# Patient Record
Sex: Male | Born: 1955 | Race: White | Hispanic: No | Smoking: Never smoker
Health system: Southern US, Community
[De-identification: ages and names within clinical notes are randomized; demographics above are authoritative.]

## PROBLEM LIST (undated history)

## (undated) HISTORY — PX: NOSE SURGERY: SHX723

## (undated) HISTORY — PX: FINGER SURGERY: SHX640

---

## 2006-06-29 ENCOUNTER — Emergency Department (HOSPITAL_COMMUNITY): Admission: EM | Admit: 2006-06-29 | Discharge: 2006-06-29 | Payer: Self-pay | Admitting: Emergency Medicine

## 2006-06-30 ENCOUNTER — Ambulatory Visit (HOSPITAL_COMMUNITY): Admission: RE | Admit: 2006-06-30 | Discharge: 2006-06-30 | Payer: Self-pay | Admitting: Emergency Medicine

## 2006-07-03 ENCOUNTER — Encounter: Admission: RE | Admit: 2006-07-03 | Discharge: 2006-07-03 | Payer: Self-pay | Admitting: Emergency Medicine

## 2009-09-16 ENCOUNTER — Emergency Department (HOSPITAL_COMMUNITY): Admission: EM | Admit: 2009-09-16 | Discharge: 2009-09-16 | Payer: Self-pay | Admitting: Emergency Medicine

## 2011-03-26 LAB — BASIC METABOLIC PANEL
Calcium: 8.9 mg/dL (ref 8.4–10.5)
GFR calc non Af Amer: >60 mL/min (ref >60)
Sodium: 137 mEq/L (ref 135–145)

## 2011-03-26 LAB — BLOOD GAS, ARTERIAL
Acid-Base Excess: 3.1 mmol/L — ABNORMAL HIGH (ref 0.0–2.0)
TCO2: 24.4 mmol/L (ref 0–100)
pCO2 arterial: 44.7 mmHg (ref 35.0–45.0)
pH, Arterial: 7.406 (ref 7.350–7.450)
pO2, Arterial: 70.4 mmHg — ABNORMAL LOW (ref 80.0–100.0)

## 2011-03-26 LAB — CBC
HCT: 36.3 % — ABNORMAL LOW (ref 39.0–52.0)
Platelets: 250 10*3/uL (ref 150–400)

## 2011-03-26 LAB — DIFFERENTIAL
Basophils Absolute: 0 10*3/uL (ref 0.0–0.1)
Basophils Relative: 0 % (ref 0–1)
Eosinophils Relative: 4 % (ref 0–5)
Neutrophils Relative %: 65 % (ref 43–77)

## 2011-06-05 ENCOUNTER — Emergency Department (HOSPITAL_COMMUNITY): Payer: Self-pay

## 2011-06-05 ENCOUNTER — Emergency Department (HOSPITAL_COMMUNITY)
Admission: EM | Admit: 2011-06-05 | Discharge: 2011-06-05 | Disposition: A | Discharge status: Home / Self Care | Payer: Self-pay | Attending: Emergency Medicine | Admitting: Emergency Medicine

## 2011-06-05 DIAGNOSIS — K089 Disorder of teeth and supporting structures, unspecified: Secondary | ICD-10-CM | POA: Insufficient documentation

## 2011-06-05 DIAGNOSIS — L0201 Cutaneous abscess of face: Secondary | ICD-10-CM | POA: Insufficient documentation

## 2011-06-05 DIAGNOSIS — L03211 Cellulitis of face: Secondary | ICD-10-CM | POA: Insufficient documentation

## 2011-06-05 LAB — BASIC METABOLIC PANEL
BUN: 8 mg/dL (ref 6–23)
Calcium: 10.2 mg/dL (ref 8.4–10.5)
Chloride: 99 mEq/L (ref 96–112)
Creatinine, Ser: 0.97 mg/dL (ref 0.50–1.35)
GFR calc non Af Amer: >60 mL/min (ref >60)
Potassium: 4.1 mEq/L (ref 3.5–5.1)
Sodium: 138 mEq/L (ref 135–145)

## 2011-06-05 LAB — DIFFERENTIAL
Basophils Absolute: 0.1 10*3/uL (ref 0.0–0.1)
Basophils Relative: 1 % (ref 0–1)
Eosinophils Absolute: 0.3 10*3/uL (ref 0.0–0.7)
Eosinophils Relative: 2 % (ref 0–5)
Lymphs Abs: 2.8 10*3/uL (ref 0.7–4.0)
Monocytes Absolute: 1.4 10*3/uL — ABNORMAL HIGH (ref 0.1–1.0)
Monocytes Relative: 10 % (ref 3–12)
Neutrophils Relative %: 68 % (ref 43–77)

## 2011-06-05 LAB — CBC
Hemoglobin: 13.7 g/dL (ref 13.0–17.0)
RBC: 4.33 MIL/uL (ref 4.22–5.81)
WBC: 14.3 10*3/uL — ABNORMAL HIGH (ref 4.0–10.5)

## 2011-06-05 MED ORDER — IOHEXOL 300 MG/ML  SOLN
100.0000 mL | Freq: Once | INTRAMUSCULAR | Status: AC | PRN
  Administered 2011-06-05: 100 mL via INTRAVENOUS

## 2011-10-15 ENCOUNTER — Emergency Department (HOSPITAL_COMMUNITY)
Admission: EM | Admit: 2011-10-15 | Discharge: 2011-10-15 | Disposition: A | Discharge status: Home / Self Care | Payer: No Typology Code available for payment source | Attending: Emergency Medicine | Admitting: Emergency Medicine

## 2011-10-15 ENCOUNTER — Emergency Department (HOSPITAL_COMMUNITY): Payer: No Typology Code available for payment source

## 2011-10-15 DIAGNOSIS — R7309 Other abnormal glucose: Secondary | ICD-10-CM | POA: Insufficient documentation

## 2011-10-15 DIAGNOSIS — D72829 Elevated white blood cell count, unspecified: Secondary | ICD-10-CM | POA: Insufficient documentation

## 2011-10-15 DIAGNOSIS — K029 Dental caries, unspecified: Secondary | ICD-10-CM | POA: Insufficient documentation

## 2011-10-15 DIAGNOSIS — K047 Periapical abscess without sinus: Secondary | ICD-10-CM | POA: Insufficient documentation

## 2011-10-15 DIAGNOSIS — Z6841 Body Mass Index (BMI) 40.0 and over, adult: Secondary | ICD-10-CM | POA: Insufficient documentation

## 2011-10-15 LAB — CBC
Hemoglobin: 13.5 g/dL (ref 13.0–17.0)
MCH: 31 pg (ref 26.0–34.0)
RBC: 4.35 MIL/uL (ref 4.22–5.81)
RDW: 14.1 % (ref 11.5–15.5)
WBC: 11.6 10*3/uL — ABNORMAL HIGH (ref 4.0–10.5)

## 2011-10-15 LAB — POCT I-STAT, CHEM 8
Calcium, Ion: 1.13 mmol/L (ref 1.12–1.32)
Creatinine, Ser: 1 mg/dL (ref 0.50–1.35)

## 2011-10-15 MED ORDER — SODIUM CHLORIDE 0.9 % IV SOLN: INTRAVENOUS | Status: DC

## 2011-10-15 MED ORDER — OXYCODONE HCL 5 MG PO TABS
10.0000 mg | ORAL_TABLET | Freq: Once | ORAL | Status: AC
  Administered 2011-10-15: 10 mg via ORAL
  Filled 2011-10-15: qty 2

## 2011-10-15 MED ORDER — CLINDAMYCIN HCL 150 MG PO CAPS
300.0000 mg | ORAL_CAPSULE | Freq: Once | ORAL | Status: AC
  Administered 2011-10-15: 300 mg via ORAL
  Filled 2011-10-15: qty 2

## 2011-10-15 MED ORDER — CLINDAMYCIN HCL 150 MG PO CAPS: ORAL_CAPSULE | ORAL | Status: DC

## 2011-10-15 MED ORDER — CLINDAMYCIN HCL 150 MG PO CAPS: 300.0000 mg | ORAL_CAPSULE | Freq: Once | ORAL | Status: DC

## 2011-10-15 MED ORDER — CLINDAMYCIN PHOSPHATE 900 MG/50ML IV SOLN
900.0000 mg | Freq: Once | INTRAVENOUS | Status: DC
  Filled 2011-10-15: qty 50

## 2011-10-15 NOTE — ED Provider Notes (Cosign Needed)
History     CSN: [id number] Arrival date & time: 10/15/2011  4:26 PM   First MD Initiated Contact with Patient 10/15/11 1638      Chief Complaint  Patient presents with  . Jaw Pain    (Consider location/radiation/quality/duration/timing/severity/associated sxs/prior treatment) HPI  Patient relates he started getting pain 2 days ago in his right jaw. Denies fever or difficulty swallowing but he states it does hurt when he swallows. He denies any swelling of his tong. He relates the pain is made worse by chewing and swallowing and nothing makes it feel better. He relates he had something similar in June and was seen here and he had a CAT scan showing had cellulitis on the right side of his face without obvious periodontal abscess. Patient relates he knows he needs to see a dentist for some dental problems but he has not done that yet.  Primary care physician is Dr. Janna Arch  History reviewed. No pertinent past medical history. Morbid obesity Patient needs bilateral knee replacement   Past Surgical History  Procedure Date  . Total knee arthroplasty     History reviewed. No pertinent family history.  History  Substance Use Topics  . Smoking status: Never Smoker   . Smokeless tobacco: Not on file  . Alcohol Use: No   Patient's Medications        Previous Medications   IBUPROFEN (ADVIL,MOTRIN) 800 MG TABLET    Take 800 mg by mouth every 8 (eight) hours as needed. For pain    OXYCODONE-ACETAMINOPHEN (LYNOX) 7.5-300 MG PER TABLET    Take 1 tablet by mouth every 4 (four) hours as needed. For pain    Modified Medications   No medications on file  Discontinued Medications   No medications on file     Review of Systems  All other systems reviewed and are negative.    Allergies  Review of patient's allergies indicates no known allergies.  Home Medications   Current Outpatient Rx  Name Route Sig Dispense Refill  . IBUPROFEN 800 MG PO TABS Oral Take 800 mg by mouth  every 8 (eight) hours as needed. For pain     . OXYCODONE-ACETAMINOPHEN 7.5-300 MG PO TABS Oral Take 1 tablet by mouth every 4 (four) hours as needed. For pain        BP 154/50  Pulse 63  Temp(Src) 98.3 F (36.8 C) (Oral)  Resp 20  Ht 6' (1.829 m)  Wt 430 lb (195.047 kg)  BMI 58.32 kg/m2  SpO2 98%  Physical Exam  Constitutional: He is oriented to person, place, and time. He appears well-developed and well-nourished.       Moderately obese  HENT:  Head: Normocephalic and atraumatic.  Mouth/Throat:         Patient has a very thick neck there is no obvious swelling of his neck however it would be very easy to miss. He has a fairly long facial hair about 34 cm in length. He's tender diffusely along the right mandible there is no redness to the skin of the face or warmth. He is missing most of his molars he has most of his incisor teeth however he's noted to have diffuse dental caries there is a tooth that appears to be the first premolar that has a lot of redness of the gums with swelling that tooth and is also tender to palpation. I suspect this is the source of his pain.  Eyes: Conjunctivae and EOM are normal. Pupils are equal, round,  and reactive to light.  Neck: Normal range of motion. Neck supple.  Pulmonary/Chest: Effort normal and breath sounds normal.  Neurological: He is alert and oriented to person, place, and time.  Skin: Skin is warm and dry.  Psychiatric: He has a normal mood and affect. His behavior is normal.   Results for orders placed during the hospital encounter of 10/15/11  CBC      Component Value Range   WBC 11.6 (*) 4.0 - 10.5 (K/uL)   RBC 4.35  4.22 - 5.81 (MIL/uL)   Hemoglobin 13.5  13.0 - 17.0 (g/dL)   HCT 65.7  84.6 - 96.2 (%)   MCV 94.7  78.0 - 100.0 (fL)   MCH 31.0  26.0 - 34.0 (pg)   MCHC 32.8  30.0 - 36.0 (g/dL)   RDW 95.2  84.1 - 32.4 (%)   Platelets 226  150 - 400 (K/uL)  POCT I-STAT, CHEM 8      Component Value Range   Sodium 138  135 - 145  (mEq/L)   Potassium 4.3  3.5 - 5.1 (mEq/L)   Chloride 100  96 - 112 (mEq/L)   BUN 13  6 - 23 (mg/dL)   Creatinine, Ser 4.01  0.50 - 1.35 (mg/dL)   Glucose, Bld 027 (*) 70 - 99 (mg/dL)   Calcium, Ion 2.53  6.64 - 1.32 (mmol/L)   TCO2 28  0 - 100 (mmol/L)   Hemoglobin 14.6  13.0 - 17.0 (g/dL)   HCT 40.3  47.4 - 25.9 (%)   Laboratory interpretation leukocytosis and mild hyperglycemia No results found.       ED Course  Procedures (including critical care time)  1800 patient refusing to have CT scan done. We do not have [employer] here. Patient states he's too claustrophobic and does not want to try anxiolytics to even try to do the CT scan. His meds were changed from IV to po. Started on clindamycin 300 mg by mouth. He states he gets his chronic pain medicine Percocet from Dr. Janna Arch.  MDM     Diagnoses that have been ruled out:  Diagnoses that are still under consideration:  Final diagnoses:  Tooth abscess    Medications    oxycodone-acetaminophen (LYNOX) 7.5-300 MG per tablet (not administered)  oxyCODONE (Oxy IR/ROXICODONE) immediate release tablet 10 mg (10 mg Oral Given 10/15/11 1737)  clindamycin (CLEOCIN) capsule 300 mg (300 mg Oral Given 10/15/11 1813)    Devoria Albe, MD, Armando Gang    Ward Givens, MD 10/15/11 2147

## 2011-10-15 NOTE — ED Notes (Signed)
Patient refusing CT due to Claustrophobia. Dr. Lynelle Doctor made aware.  Multiple IV attempts by 2 RNs.  Dr Lynelle Doctor made aware and states she will just give patient PO medications.

## 2011-10-15 NOTE — ED Notes (Addendum)
Patient with no complaints at this time. Respirations even and unlabored. Skin warm/dry. Discharge instructions reviewed with patient at this time. Patient given opportunity to voice concerns/ask questions. Patient discharged at this time and left Emergency Department via wheelchair. 

## 2011-10-15 NOTE — ED Notes (Signed)
Pt presents with right sided jaw pain. Pt states he was seen here approx 3-4 months ago for a bacterial infection in his jaw. Pt states he feels this is the same bacterial infection. NAD at this time. Pt to triage via w/c.

## 2012-06-15 ENCOUNTER — Ambulatory Visit (HOSPITAL_COMMUNITY)
Admission: RE | Admit: 2012-06-15 | Discharge: 2012-06-15 | Disposition: A | Discharge status: Home / Self Care | Payer: No Typology Code available for payment source | Source: Ambulatory Visit | Attending: Family Medicine | Admitting: Family Medicine

## 2012-06-15 ENCOUNTER — Other Ambulatory Visit (HOSPITAL_COMMUNITY): Payer: Self-pay | Admitting: Family Medicine

## 2012-06-15 DIAGNOSIS — M1990 Unspecified osteoarthritis, unspecified site: Secondary | ICD-10-CM

## 2012-06-15 DIAGNOSIS — IMO0002 Reserved for concepts with insufficient information to code with codable children: Secondary | ICD-10-CM | POA: Insufficient documentation

## 2012-06-15 DIAGNOSIS — M1710 Unilateral primary osteoarthritis, unspecified knee: Secondary | ICD-10-CM | POA: Insufficient documentation

## 2012-06-15 DIAGNOSIS — M25569 Pain in unspecified knee: Secondary | ICD-10-CM | POA: Insufficient documentation

## 2012-08-16 ENCOUNTER — Ambulatory Visit (INDEPENDENT_AMBULATORY_CARE_PROVIDER_SITE_OTHER): Payer: Medicare Other | Admitting: Orthopedic Surgery

## 2012-08-16 ENCOUNTER — Encounter: Payer: Self-pay | Admitting: Orthopedic Surgery

## 2012-08-16 VITALS — BP 160/70 | Ht 72.0 in | Wt >= 6400 oz

## 2012-08-16 DIAGNOSIS — M1710 Unilateral primary osteoarthritis, unspecified knee: Secondary | ICD-10-CM

## 2012-08-16 DIAGNOSIS — IMO0002 Reserved for concepts with insufficient information to code with codable children: Secondary | ICD-10-CM

## 2012-08-16 NOTE — Patient Instructions (Signed)
No follow up   Bracing will be done later

## 2012-08-16 NOTE — Progress Notes (Signed)
Patient ID: Chad Schaefer, male   DOB: 08-05-1956, 56 y.o.   MRN: [id number] Chief Complaint  Patient presents with  . Knee Pain    right knee pain    56 year old male with morbid obesity evaluated previously by at least 2 other orthopedists and previously scheduled for gastric bypass presents with bilateral knee pain referred to [place] by Dr. Leeann Must.  He complains of throbbing burning 10 out of 10 constant knee pain associated with swelling. Review of systems numbness and tingling swelling of both of his lower extremities discoloration of his skin  He denies shortness of breath chest pain blurred vision weight loss or weight gain fever chills fatigue nervousness anxiety easy bruising or excessive thirst, and denies adverse food reaction  He was scheduled for gastric bypass surgery and somehow his ride fell through and he didn't get it. He was seen by 2 other orthopedists and they advised him to get down to 350 pounds and then knee replacement surgery would be possible  He has venous stasis disease which is concerning regarding knee replacement. However at this time his weight precludes any surgery  History reviewed. No pertinent past medical history.  Past Surgical History  Procedure Date  . Total knee arthroplasty   . Finger surgery   . Nose surgery    It is unlikely that this patient has no medical problems. However, he listed none. Takes no medications for any other than naproxen and oxycodone for pain  Exam reveals morbid obesity BP 160/70  Ht 6' (1.829 m)  Wt 450 lb (204.119 kg)  BMI 61.03 kg/m2 Although he has no gross deformities he has significant venous stasis disease in varus malalignment of his tibia and his knee joint. He is oriented x3 his mood and affect are normal his gait is altered and he is limping. Both knees flex about 100 and have bilateral flexion contractures less than 10. Stability and strength assessed normally. Exam difficult based on the size of his  leg. Skin changes consistent with venous stasis. Temperature of the extremities normal distal edema noted sensation intact balance good.  X-rays from the hospital end-stage degenerative joint disease bilaterally  Impression morbid obesity Impression osteoarthritis end stage DJD both knees  Plan recommend gastric bypass and then surgery at a tertiary care center. I agree with other orthopedists 350 pounds is perhaps a good weight but even at that weight knee replacement surgery is risky especially in the setting of his peripheral venous stasis disease. High risk for clot, high risk for infection. High risk for aseptic loosening.

## 2012-09-11 ENCOUNTER — Telehealth: Payer: Self-pay | Admitting: *Deleted

## 2012-09-11 NOTE — Telephone Encounter (Signed)
Ok let him know 

## 2012-09-12 NOTE — Telephone Encounter (Signed)
Patient aware.

## 2014-03-11 ENCOUNTER — Other Ambulatory Visit (HOSPITAL_COMMUNITY): Payer: Self-pay | Admitting: *Deleted

## 2014-03-11 DIAGNOSIS — C4492 Squamous cell carcinoma of skin, unspecified: Secondary | ICD-10-CM

## 2014-04-01 ENCOUNTER — Ambulatory Visit (HOSPITAL_COMMUNITY)
Admission: RE | Admit: 2014-04-01 | Discharge: 2014-04-01 | Disposition: A | Discharge status: Home / Self Care | Payer: Medicare Other | Source: Ambulatory Visit | Attending: *Deleted | Admitting: *Deleted

## 2014-04-01 ENCOUNTER — Ambulatory Visit (HOSPITAL_COMMUNITY)
Admission: RE | Admit: 2014-04-01 | Payer: Medicare Other | Source: Ambulatory Visit | Attending: *Deleted | Admitting: *Deleted

## 2014-04-01 DIAGNOSIS — C4492 Squamous cell carcinoma of skin, unspecified: Secondary | ICD-10-CM

## 2014-04-03 ENCOUNTER — Other Ambulatory Visit: Payer: Self-pay | Admitting: *Deleted

## 2014-04-03 DIAGNOSIS — C632 Malignant neoplasm of scrotum: Secondary | ICD-10-CM

## 2014-04-10 ENCOUNTER — Ambulatory Visit
Admission: RE | Admit: 2014-04-10 | Discharge: 2014-04-10 | Disposition: A | Discharge status: Home / Self Care | Payer: Medicare Other | Source: Ambulatory Visit | Attending: *Deleted | Admitting: *Deleted

## 2014-04-10 DIAGNOSIS — C632 Malignant neoplasm of scrotum: Secondary | ICD-10-CM

## 2014-04-10 MED ORDER — IOHEXOL 300 MG/ML  SOLN
150.0000 mL | Freq: Once | INTRAMUSCULAR | Status: AC | PRN
  Administered 2014-04-10: 150 mL via INTRAVENOUS

## 2014-04-10 MED ORDER — IOHEXOL 300 MG/ML  SOLN
40.0000 mL | Freq: Once | INTRAMUSCULAR | Status: AC | PRN
  Administered 2014-04-10: 40 mL via ORAL

## 2014-04-11 ENCOUNTER — Other Ambulatory Visit: Payer: Self-pay | Admitting: *Deleted

## 2014-04-11 DIAGNOSIS — D491 Neoplasm of unspecified behavior of respiratory system: Secondary | ICD-10-CM

## 2014-04-15 ENCOUNTER — Other Ambulatory Visit (HOSPITAL_COMMUNITY): Payer: Self-pay | Admitting: *Deleted

## 2014-04-15 ENCOUNTER — Encounter (HOSPITAL_COMMUNITY): Payer: Self-pay

## 2014-04-15 ENCOUNTER — Ambulatory Visit
Admission: RE | Admit: 2014-04-15 | Discharge: 2014-04-15 | Disposition: A | Discharge status: Home / Self Care | Payer: Medicare Other | Source: Ambulatory Visit | Attending: *Deleted | Admitting: *Deleted

## 2014-04-15 ENCOUNTER — Ambulatory Visit (HOSPITAL_COMMUNITY)
Admission: RE | Admit: 2014-04-15 | Discharge: 2014-04-15 | Disposition: A | Discharge status: Home / Self Care | Payer: Medicare Other | Source: Ambulatory Visit | Attending: Family Medicine | Admitting: Family Medicine

## 2014-04-15 ENCOUNTER — Ambulatory Visit (HOSPITAL_COMMUNITY)
Admission: RE | Admit: 2014-04-15 | Discharge: 2014-04-15 | Disposition: A | Discharge status: Home / Self Care | Payer: Medicare Other | Source: Ambulatory Visit | Attending: *Deleted | Admitting: *Deleted

## 2014-04-15 ENCOUNTER — Ambulatory Visit (HOSPITAL_COMMUNITY): Admission: RE | Admit: 2014-04-15 | Payer: Medicare Other | Source: Ambulatory Visit

## 2014-04-15 DIAGNOSIS — D499 Neoplasm of unspecified behavior of unspecified site: Secondary | ICD-10-CM

## 2014-04-15 DIAGNOSIS — C3490 Malignant neoplasm of unspecified part of unspecified bronchus or lung: Secondary | ICD-10-CM | POA: Insufficient documentation

## 2014-04-15 DIAGNOSIS — D491 Neoplasm of unspecified behavior of respiratory system: Secondary | ICD-10-CM

## 2014-04-15 MED ORDER — IOHEXOL 300 MG/ML  SOLN
100.0000 mL | Freq: Once | INTRAMUSCULAR | Status: AC | PRN
  Administered 2014-04-15: 100 mL via INTRAVENOUS

## 2015-03-25 ENCOUNTER — Other Ambulatory Visit (HOSPITAL_COMMUNITY): Payer: Self-pay | Admitting: Family Medicine

## 2015-03-25 ENCOUNTER — Ambulatory Visit (HOSPITAL_COMMUNITY)
Admission: RE | Admit: 2015-03-25 | Discharge: 2015-03-25 | Disposition: A | Discharge status: Home / Self Care | Payer: Medicare Other | Source: Ambulatory Visit | Attending: Family Medicine | Admitting: Family Medicine

## 2015-03-25 DIAGNOSIS — M25552 Pain in left hip: Secondary | ICD-10-CM | POA: Diagnosis present

## 2015-03-25 DIAGNOSIS — M1612 Unilateral primary osteoarthritis, left hip: Secondary | ICD-10-CM | POA: Diagnosis not present

## 2016-04-08 IMAGING — CT CT ABD-PELV W/ CM
2 of 6 series · 13 of 46 positions shown, 19 images · IV contrast (OMNI 300/WATER & [ID] OMNI 300)
Comparison: None.

CLINICAL DATA: Squamous cell cancer of the scrotal skin. Hematuria.

EXAM:
CT ABDOMEN AND PELVIS WITH CONTRAST
TECHNIQUE: Multidetector CT imaging of the abdomen and pelvis was performed
using the standard protocol following bolus administration of
intravenous contrast.
CONTRAST:  150mL OMNIPAQUE IOHEXOL 300 MG/ML  SOLN

[Series 3: abd/pelvis with · axial · 0.98mm/px · z∈[-700,-107]mm · 10 of 142 slices shown, 16 images]
[im 13/142  soft-tissue]
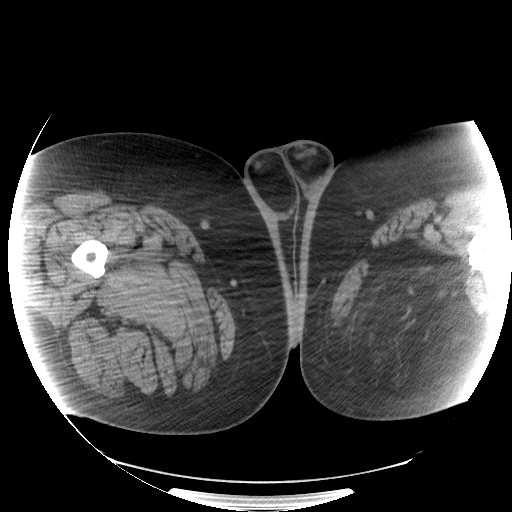
[im 13/142  bone]
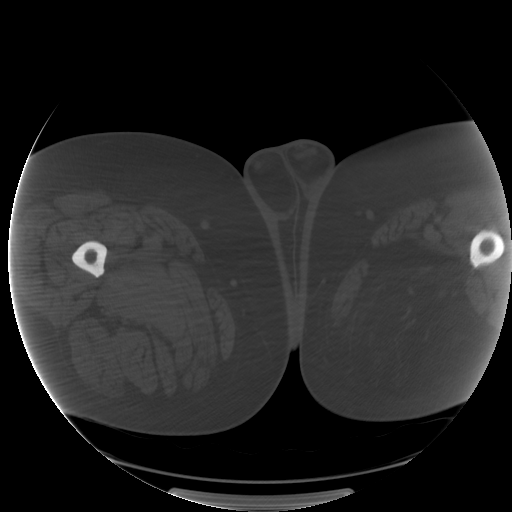
[im 26/142  soft-tissue]
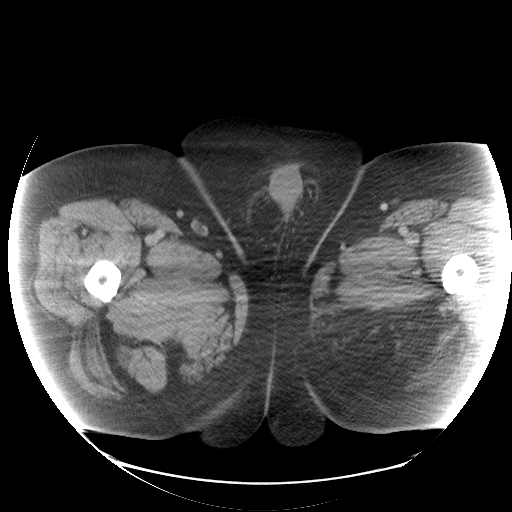
[im 39/142  soft-tissue]
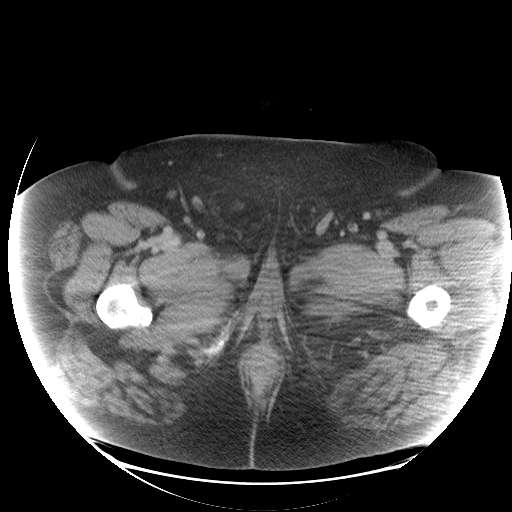
[im 52/142  soft-tissue]
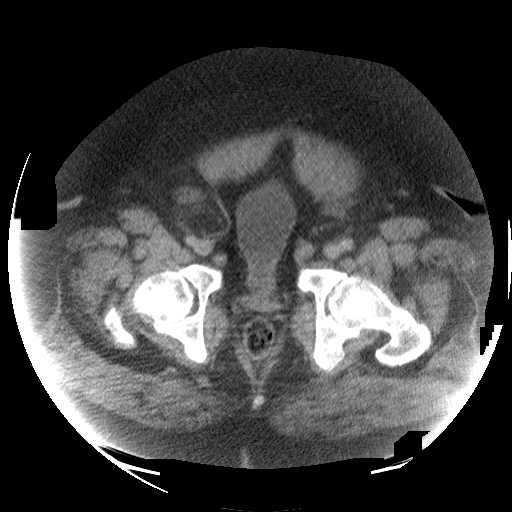
[im 65/142  soft-tissue]
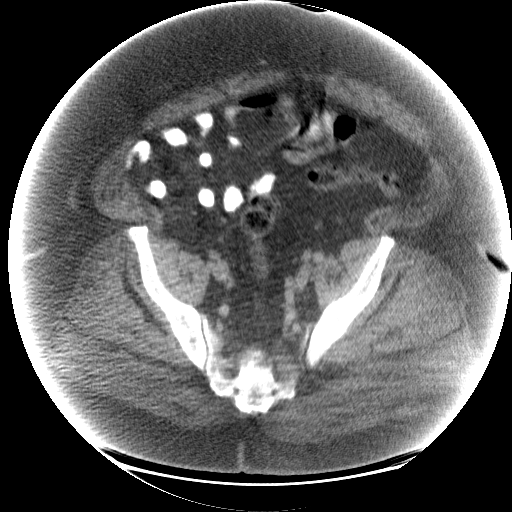
[im 77/142  soft-tissue]
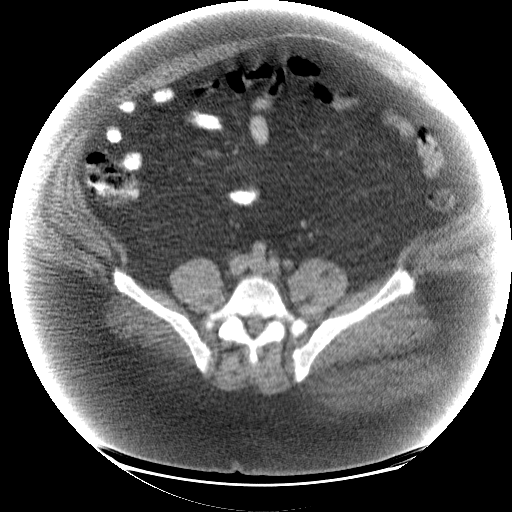
[im 90/142  soft-tissue]
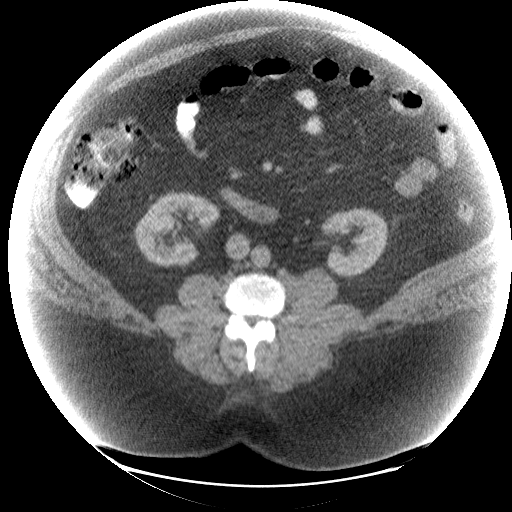
[im 90/142  lung]
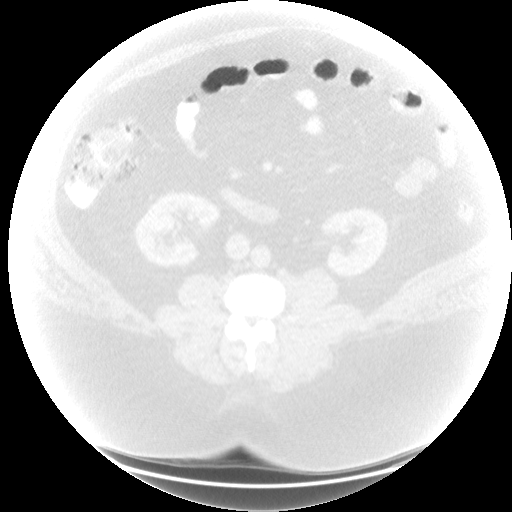
[im 103/142  soft-tissue]
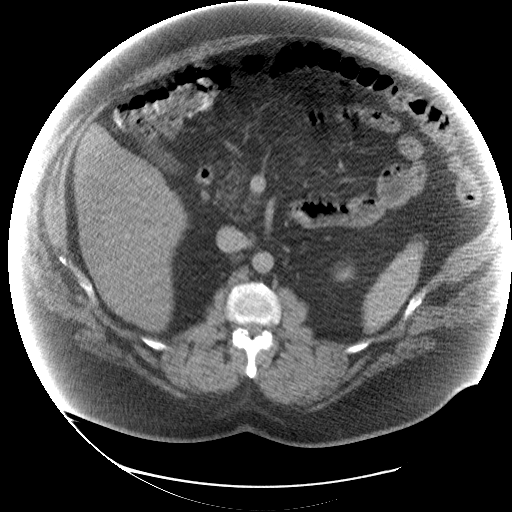
[im 103/142  lung]
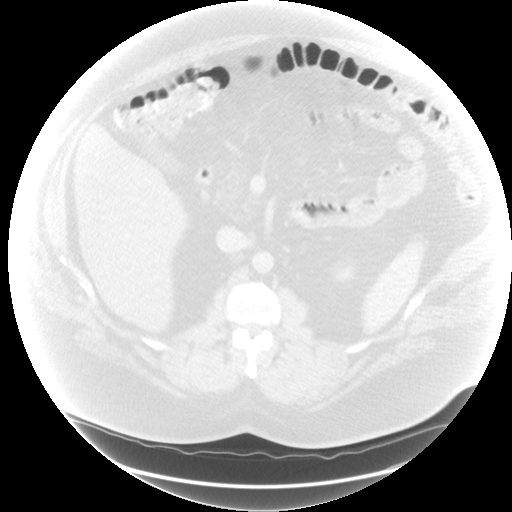
[im 116/142  soft-tissue]
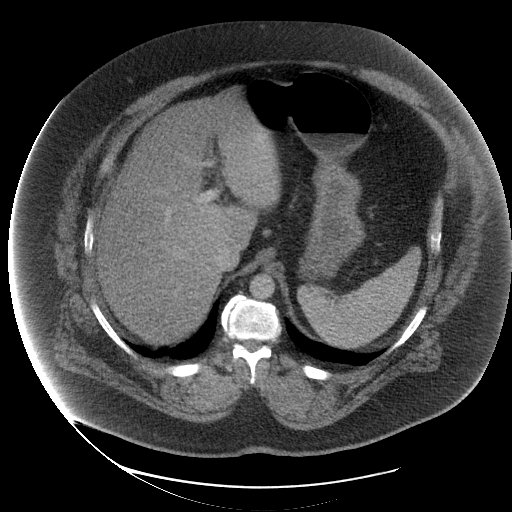
[im 116/142  lung]
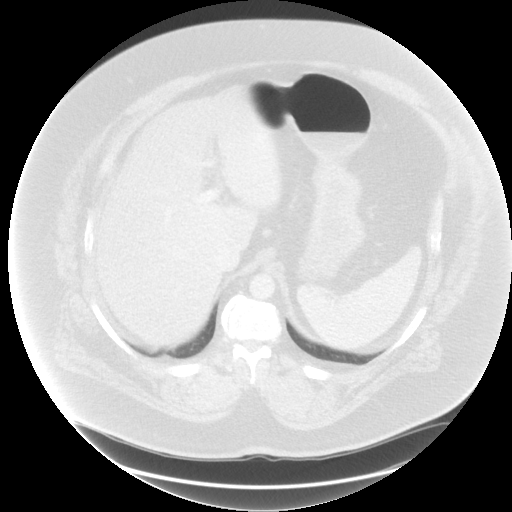
[im 116/142  bone]
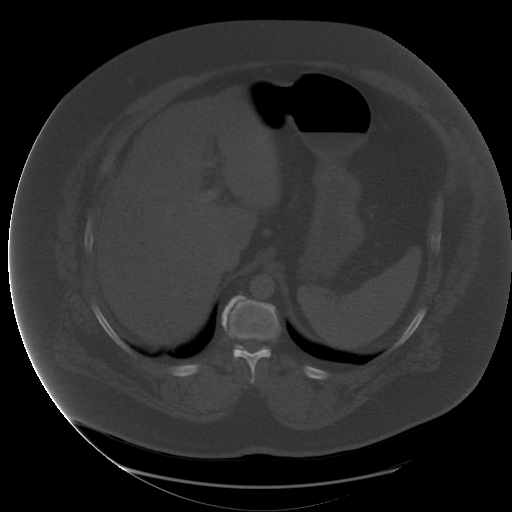
[im 129/142  soft-tissue]
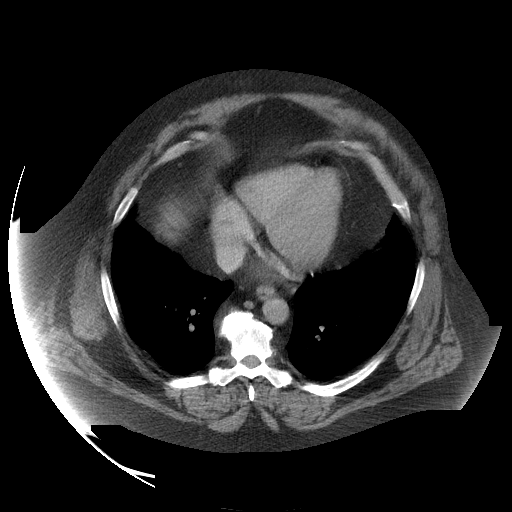
[im 129/142  lung]
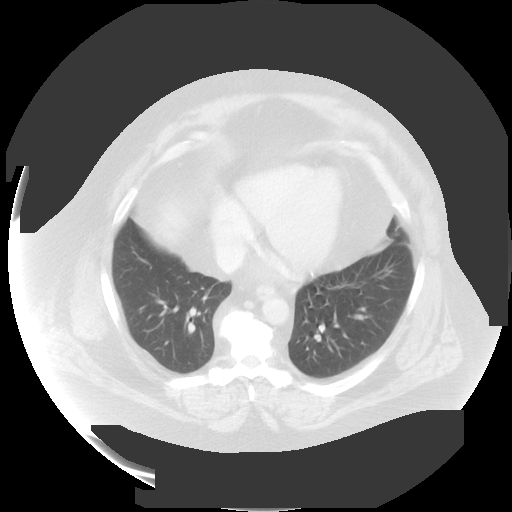

[Series 200: cor · coronal · 1.45mm/px · 3 of 225 slices shown]
[im 75/225  soft-tissue]
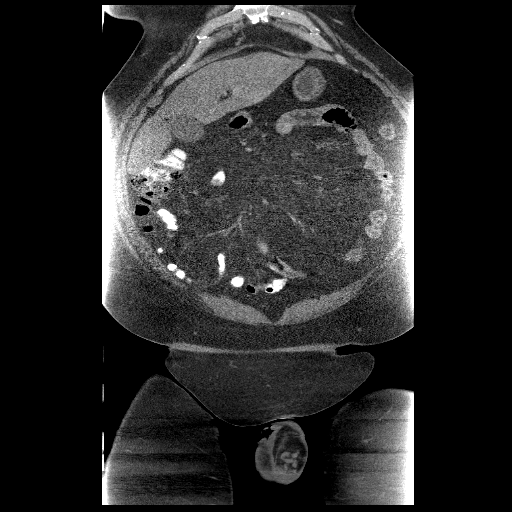
[im 100/225  soft-tissue]
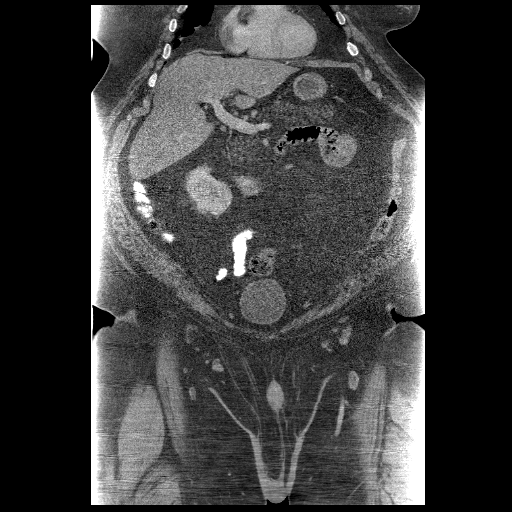
[im 125/225  soft-tissue]
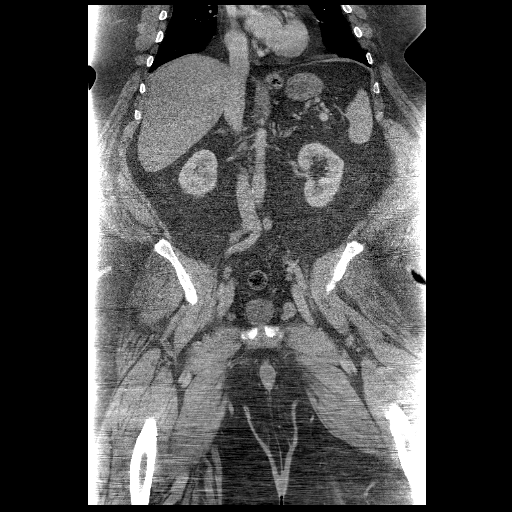

[13 of 46 positions shown; findings below may reference images not displayed]

FINDINGS: Lung bases show a 1.0 x 2.2 cm slightly nodular lesion in the
inferior right lower lobe (image 24). Heart is at the upper limits
of normal in size to mildly enlarged. No pericardial or pleural
effusion.

Image quality is degraded by body habitus. Liver is decreased in
attenuation diffusely. Stones are seen in the gallbladder. Adrenal
glands, kidneys, spleen, pancreas, stomach and bowel are grossly
unremarkable. Bilateral inguinal hernias contain fat. Bladder is
grossly unremarkable. Lymph nodes in the pelvis measure up to 1.9 cm
in short axis in the left external iliac station (series 3, image
89). No free fluid. No worrisome lytic or sclerotic lesions.
Degenerative changes are seen in the spine.
IMPRESSION: 1. Image quality is markedly degraded by body habitus.
2. Bilateral external iliac chain adenopathy.
3. Nodular lesion in the right lower lobe. Follow-up in 3 months is
recommended as malignancy cannot be excluded. This recommendation
follows ACR consensus guidelines: White Paper of the ACR Incidental
Findings Committee II on Splenic and Nodal Findings. [HOSPITAL] 2466;[DATE]. These results will be called to the ordering
clinician or representative by the Radiologist Assistant, and
communication documented in the PACS Dashboard.
4. Hepatic steatosis.
5. Cholelithiasis.
6. Small bilateral inguinal hernias contain fat.

## 2017-05-19 ENCOUNTER — Inpatient Hospital Stay
Admission: RE | Admit: 2017-05-19 | Discharge: 2017-06-20 | Disposition: A | Discharge status: Skilled Nursing Facility | Payer: Medicare Other | Attending: Internal Medicine | Admitting: Internal Medicine

## 2017-05-19 DIAGNOSIS — Z906 Acquired absence of other parts of urinary tract: Secondary | ICD-10-CM

## 2017-05-19 DIAGNOSIS — N179 Acute kidney failure, unspecified: Secondary | ICD-10-CM

## 2017-05-20 ENCOUNTER — Other Ambulatory Visit (HOSPITAL_COMMUNITY): Payer: Medicare Other

## 2017-05-20 ENCOUNTER — Institutional Professional Consult (permissible substitution) (HOSPITAL_COMMUNITY): Payer: Medicare Other

## 2017-05-20 LAB — COMPREHENSIVE METABOLIC PANEL WITH GFR
ALT: 16 U/L — ABNORMAL LOW (ref 17–63)
AST: 29 U/L (ref 15–41)
Albumin: 1.8 g/dL — ABNORMAL LOW (ref 3.5–5.0)
Alkaline Phosphatase: 139 U/L — ABNORMAL HIGH (ref 38–126)
Anion gap: 9 (ref 5–15)
BUN: 65 mg/dL — ABNORMAL HIGH (ref 6–20)
CO2: 25 mmol/L (ref 22–32)
Calcium: 6.7 mg/dL — ABNORMAL LOW (ref 8.9–10.3)
Chloride: 102 mmol/L (ref 101–111)
Creatinine, Ser: 3.95 mg/dL — ABNORMAL HIGH (ref 0.61–1.24)
GFR calc Af Amer: 17 mL/min — ABNORMAL LOW (ref >60)
GFR calc non Af Amer: 15 mL/min — ABNORMAL LOW (ref >60)
Glucose, Bld: 88 mg/dL (ref 65–99)
Potassium: 3.3 mmol/L — ABNORMAL LOW (ref 3.5–5.1)
Sodium: 136 mmol/L (ref 135–145)
Total Bilirubin: 0.9 mg/dL (ref 0.3–1.2)
Total Protein: 5.1 g/dL — ABNORMAL LOW (ref 6.5–8.1)

## 2017-05-20 LAB — CBC
HCT: 26.9 % — ABNORMAL LOW (ref 39.0–52.0)
Hemoglobin: 8.5 g/dL — ABNORMAL LOW (ref 13.0–17.0)
MCH: 29.1 pg (ref 26.0–34.0)
MCHC: 31.6 g/dL (ref 30.0–36.0)
MCV: 92.1 fL (ref 78.0–100.0)
Platelets: 128 10*3/uL — ABNORMAL LOW (ref 150–400)
RBC: 2.92 MIL/uL — ABNORMAL LOW (ref 4.22–5.81)
RDW: 17.8 % — ABNORMAL HIGH (ref 11.5–15.5)
WBC: 8.8 10*3/uL (ref 4.0–10.5)

## 2017-05-20 LAB — VANCOMYCIN, TROUGH: VANCOMYCIN TR: 21 ug/mL — AB (ref 15–20)

## 2017-05-20 LAB — MAGNESIUM: Magnesium: 1.3 mg/dL — ABNORMAL LOW (ref 1.7–2.4)

## 2017-05-20 NOTE — Consult Note (Signed)
[facility] CONSULT NOTE    Date: 05/20/2017                  Patient Name:  Chad Schaefer  MRN: [id number]  DOB: 1956/05/24  Age / Sex: 61 y.o., male         PCP: Lucia Gaskins, MD                 Service Requesting Consult: Hospitalist                 Reason for Consult: Acute renal failure            History of Present Illness: Patient is a 61 y.o. male with a PMHx of obesity, alcohol abuse, ambulatory dysfunction, who was admitted to [facility] on 05/19/2017 for ongoing treatment of severe sacral decubitus as well as wet gangrene of the right foot associated with sepsis and osteomyelitis. Patient was offered above-the-knee amputation on the right but apparently declined. He apparently has chronic kidney disease however we were unable to determine his baseline creatinine is trending those were not provided and his most recent discharge summary. However discharge summary did mention superimposedchronic kidney disease.  During the patient's most recent hospitalization in [place] he did have acute renal failure requiring hemodialysis. On May 28 10 per dialysis catheter was discontinued. The patient is apparently making some urine at this point in time.  However at the moment his BUN is up to 65 with a creatinine of 3.95 and EGFR of 15.   Medications: Current medications: Meropenem 2 g IV daily, aspirin 81 mg daily, Lipitor 40 mg each bedtime, folic acid 1 mg daily, heparin 5000 units subcutaneous every 8 hours, hydrocodone/acetaminophen one tablet every 6 hours, Protonix 40 mg daily, prednisone 20 mg daily, sevelamer 800 mg 3 times a day, thiamine 100 mg daily    Allergies: cefazolin   Past Medical History: Sacral decubitus ulcer Morbid obesity Gangrene of the right foot Hyperlipidemia Posterior wall it is of the right foot Severe protein calorie malnutrition Chronic kidney disease, stage unknown  Past Surgical History: Past  Surgical History:  Procedure Laterality Date  . FINGER SURGERY    . NOSE SURGERY       Family History: Family History  Problem Relation Age of Onset  . Arthritis Unknown      Social History: The patient is divorced, lives with his daughter, father had history of renal failure but did not have dialysis. Patient's sister has advanced renal dysfunction and will be starting doses soon.   Review of Systems: Review of Systems  Constitutional: Positive for malaise/fatigue. Negative for chills and fever.  HENT: Negative for ear pain, hearing loss and tinnitus.   Eyes: Negative for blurred vision, double vision and photophobia.  Respiratory: Negative for cough, hemoptysis and sputum production.   Cardiovascular: Negative for chest pain, palpitations and orthopnea.  Gastrointestinal: Negative for abdominal pain, heartburn, nausea and vomiting.  Genitourinary: Negative for dysuria and urgency.  Musculoskeletal: Positive for back pain and myalgias.  Skin: Negative for itching and rash.  Neurological: Negative for dizziness and focal weakness.  Endo/Heme/Allergies: Does not bruise/bleed easily.  Psychiatric/Behavioral: Negative for depression. The patient is not nervous/anxious.      Vital Signs: Temperature 99 pulse 74 respirations 12 blood pressure 105/61  Weight trends: There were no vitals filed for this visit.  Physical Exam: General: Morbidly obese male, no acute distress  Head: Normocephalic, atraumatic.  Eyes: Anicteric, EOMI  Nose:  Mucous membranes moist, not inflammed, nonerythematous.  Throat: Oropharynx nonerythematous, no exudate appreciated.   Neck: Supple, trachea midline.  Lungs:  Normal respiratory effort. Clear to auscultation BL without crackles or wheezes.  Heart: RRR. S1 and S2 normal without gallop, murmur, or rubs.  Abdomen:  BS normoactive. Soft, Nondistended, non-tender.  No masses or organomegaly.  Extremities: 1+ brawny b/l LE edema, both feet wrapped   Neurologic: Awake, alert, oriented x 3, follows commands  Skin: Lymphedema b/l LE's    Lab results: Basic Metabolic Panel:  Recent Labs Lab 05/20/17 0637  NA 136  K 3.3*  CL 102  CO2 25  GLUCOSE 88  BUN 65*  CREATININE 3.95*  CALCIUM 6.7*  MG 1.3*    Liver Function Tests:  Recent Labs Lab 05/20/17 0637  AST 29  ALT 16*  ALKPHOS 139*  BILITOT 0.9  PROT 5.1*  ALBUMIN 1.8*   No results for input(s): LIPASE, AMYLASE in the last 168 hours. No results for input(s): AMMONIA in the last 168 hours.  CBC:  Recent Labs Lab 05/20/17 0637  WBC 8.8  HGB 8.5*  HCT 26.9*  MCV 92.1  PLT 128*    Cardiac Enzymes: No results for input(s): CKTOTAL, CKMB, CKMBINDEX, TROPONINI in the last 168 hours.  BNP: Invalid input(s): POCBNP  CBG: No results for input(s): GLUCAP in the last 168 hours.  Microbiology: No results found for this or any previous visit.  Coagulation Studies: No results for input(s): LABPROT, INR in the last 72 hours.  Urinalysis: No results for input(s): COLORURINE, LABSPEC, PHURINE, GLUCOSEU, HGBUR, BILIRUBINUR, KETONESUR, PROTEINUR, UROBILINOGEN, NITRITE, LEUKOCYTESUR in the last 72 hours.  Invalid input(s): APPERANCEUR    Imaging:  No results found.   Assessment & Plan: Pt is a 61 y.o. male with a PMHx of obesity, alcohol abuse, ambulatory dysfunction, who was admitted to [facility] on 05/19/2017 for ongoing treatment of severe sacral decubitus as well as wet gangrene of the right foot associated with sepsis and osteomyelitis.   1.  Acute renal failure, etiology currently unclear, but [facility] hospital felt this was ATN.  2.  Chronic kidney disease unspecified (baseline Cr unknown at this time.) 3.  Anemia of CKD.  4.  Osteomyelitis of the right foot. 5.  Secondary hyperparathyroidisim.    Plan:  We are asked to see the patient for evaluation and management of acute renal failure in the setting of chronic kidney disease. He  states chronic kidney disease is currently unknown as the provided discharge summary did not have a baseline creatinine. Upon arrival here his BUN was 65 with a creatinine of 3.95. The patient did end up requiring dialysis at the outside hospital for acute renal failure. His temporary dialysis catheter was removed on 05/16/17.  No urgent indication for dialysis at the moment. Agree with continued IV fluid hydration for now. If renal function continues to decline we may need to consider renal replacement therapy. Given the fact that there is some blood in the urine we will check ANA, ANCA antibodies, GBM antibodies, C3, C4. We will also check renal ultrasound to make sure there's no underlying obstruction. Further plan as patient progresses.

## 2017-05-21 LAB — RENAL FUNCTION PANEL
ALBUMIN: 1.7 g/dL — AB (ref 3.5–5.0)
ANION GAP: 10 (ref 5–15)
BUN: 71 mg/dL — ABNORMAL HIGH (ref 6–20)
CALCIUM: 6.8 mg/dL — AB (ref 8.9–10.3)
CO2: 24 mmol/L (ref 22–32)
CREATININE: 4.2 mg/dL — AB (ref 0.61–1.24)
Chloride: 103 mmol/L (ref 101–111)
GFR, EST AFRICAN AMERICAN: 16 mL/min — AB (ref >60)
GFR, EST NON AFRICAN AMERICAN: 14 mL/min — AB (ref >60)
Glucose, Bld: 114 mg/dL — ABNORMAL HIGH (ref 65–99)
PHOSPHORUS: 6.3 mg/dL — AB (ref 2.5–4.6)
Potassium: 3 mmol/L — ABNORMAL LOW (ref 3.5–5.1)
SODIUM: 137 mmol/L (ref 135–145)

## 2017-05-22 LAB — MPO/PR-3 (ANCA) ANTIBODIES

## 2017-05-23 LAB — RENAL FUNCTION PANEL
Albumin: 1.9 g/dL — ABNORMAL LOW (ref 3.5–5.0)
Anion gap: 8 (ref 5–15)
BUN: 69 mg/dL — AB (ref 6–20)
CHLORIDE: 103 mmol/L (ref 101–111)
CO2: 25 mmol/L (ref 22–32)
Calcium: 6.6 mg/dL — ABNORMAL LOW (ref 8.9–10.3)
Creatinine, Ser: 4.19 mg/dL — ABNORMAL HIGH (ref 0.61–1.24)
GFR, EST AFRICAN AMERICAN: 16 mL/min — AB (ref >60)
GFR, EST NON AFRICAN AMERICAN: 14 mL/min — AB (ref >60)
Glucose, Bld: 114 mg/dL — ABNORMAL HIGH (ref 65–99)
POTASSIUM: 3.6 mmol/L (ref 3.5–5.1)
Phosphorus: 6.1 mg/dL — ABNORMAL HIGH (ref 2.5–4.6)
Sodium: 136 mmol/L (ref 135–145)

## 2017-05-23 LAB — VANCOMYCIN, RANDOM: Vancomycin Rm: <4

## 2017-05-23 LAB — C4 COMPLEMENT: Complement C4, Body Fluid: 23 mg/dL (ref 14–44)

## 2017-05-23 LAB — C3 COMPLEMENT: C3 COMPLEMENT: 126 mg/dL (ref 82–167)

## 2017-05-23 LAB — ANTINUCLEAR ANTIBODIES, IFA: ANTINUCLEAR ANTIBODIES, IFA: NEGATIVE

## 2017-05-23 NOTE — Progress Notes (Signed)
  Subjective:   Patient is doing fair. Working with physical therapist. Sitting at the edge of the bed Good UOP noted in foley. Denies any SOB or chest pain S Creatinine is about the same   Objective:  Vital signs in last 24 hours:    T 97.3  P 61  RR 18  BP 104/51  Intake/Output:    Physical Exam: General: NAD, sitting up at the side of bed  Head/ENT Normocephalic, moist oral mucus membranes  Eyes:  anicteric  Neck: supple  Lungs:  coarse at bases  Heart: Regular, no rub  Abdomen:   soft, obese, non tender  Extremities:  + b/l chronic edema, both feet in bandage  Neurologic: Alert, able to answer questions  Skin: Chronic statis dermatitis  Foley present    Basic Metabolic Panel:  Recent Labs Lab 05/20/17 0637 05/21/17 1420 05/23/17 0655  NA 136 137 136  K 3.3* 3.0* 3.6  CL 102 103 103  CO2 25 24 25   GLUCOSE 88 114* 114*  BUN 65* 71* 69*  CREATININE 3.95* 4.20* 4.19*  CALCIUM 6.7* 6.8* 6.6*  MG 1.3*  --   --   PHOS  --  6.3* 6.1*   Lab Results  Component Value Date   CALCIUM 6.6 (L) 05/23/2017   CAION 1.13 10/15/2011   PHOS 6.1 (H) 05/23/2017    CBC:  Recent Labs Lab 05/20/17 0637  WBC 8.8  HGB 8.5*  HCT 26.9*  MCV 92.1  PLT 128*     Microbiology:  No results found for this or any previous visit (from the past 240 hour(s)).   No results found for: HEPBSAG, HEPBSAB, HEPBIGM  Imaging: No results found.   Medications:    Assessment/ Plan:  61 y.o. caucasian male  with morbid obesity, alcohol abuse, ambulatory dysfunction, who was admitted to [facility] on 05/19/2017 for ongoing treatment of severe sacral decubitus as well as wet gangrene of the right foot associated with sepsis and osteomyelitis.   1.  Acute renal failure, etiology currently unclear, but [facility] hospital felt this was ATN.  2.  Chronic kidney disease unspecified (baseline Cr unknown at this time.) Cr 1.1 in 08/2014 3.  Anemia of CKD.  4.  Osteomyelitis of  the right foot. 5.  Hypocalcemia and hyperphosphatemia  The patient did end up requiring dialysis at the outside hospital for acute renal failure. His temporary dialysis catheter was removed on 05/16/17.  No urgent indication for dialysis at the moment.  At present, he has good uop. S Creatinine is about the same. Electrolytes and Volume status are acceptable No acute indication for Dialysis at present. Abx for Osteomyelitis as per primary team. Dosing assistance from pharmacy. Corrected calcium is 8.3 since albumin Is low However, will start patient on Calcium acetate and small dose of Calcitriol Will continue to monitor closely.     LOS: 0 Chad Schaefer 6/4/20184:05 PM

## 2017-05-24 LAB — RENAL FUNCTION PANEL
ANION GAP: 11 (ref 5–15)
Albumin: 1.8 g/dL — ABNORMAL LOW (ref 3.5–5.0)
BUN: 60 mg/dL — ABNORMAL HIGH (ref 6–20)
CHLORIDE: 101 mmol/L (ref 101–111)
CO2: 25 mmol/L (ref 22–32)
Calcium: 6.7 mg/dL — ABNORMAL LOW (ref 8.9–10.3)
Creatinine, Ser: 4.07 mg/dL — ABNORMAL HIGH (ref 0.61–1.24)
GFR calc non Af Amer: 15 mL/min — ABNORMAL LOW (ref >60)
GFR, EST AFRICAN AMERICAN: 17 mL/min — AB (ref >60)
Glucose, Bld: 119 mg/dL — ABNORMAL HIGH (ref 65–99)
POTASSIUM: 2.9 mmol/L — AB (ref 3.5–5.1)
Phosphorus: 5.4 mg/dL — ABNORMAL HIGH (ref 2.5–4.6)
Sodium: 137 mmol/L (ref 135–145)

## 2017-05-24 LAB — ANCA TITERS
Atypical P-ANCA titer: <1:20 {titer}
P-ANCA: <1:20 {titer}

## 2017-05-24 LAB — URINALYSIS, ROUTINE W REFLEX MICROSCOPIC
BILIRUBIN URINE: NEGATIVE
Glucose, UA: 150 mg/dL — AB
KETONES UR: NEGATIVE mg/dL
NITRITE: NEGATIVE
Protein, ur: 30 mg/dL — AB
Specific Gravity, Urine: 1.008 (ref 1.005–1.030)
Squamous Epithelial / LPF: NONE SEEN
pH: 7 (ref 5.0–8.0)

## 2017-05-24 LAB — VITAMIN D 25 HYDROXY (VIT D DEFICIENCY, FRACTURES): VIT D 25 HYDROXY: 14.6 ng/mL — AB (ref 30.0–100.0)

## 2017-05-25 LAB — BASIC METABOLIC PANEL
ANION GAP: 9 (ref 5–15)
BUN: 53 mg/dL — AB (ref 6–20)
CHLORIDE: 100 mmol/L — AB (ref 101–111)
CO2: 27 mmol/L (ref 22–32)
Calcium: 7.2 mg/dL — ABNORMAL LOW (ref 8.9–10.3)
Creatinine, Ser: 3.9 mg/dL — ABNORMAL HIGH (ref 0.61–1.24)
GFR, EST AFRICAN AMERICAN: 18 mL/min — AB (ref >60)
GFR, EST NON AFRICAN AMERICAN: 15 mL/min — AB (ref >60)
Glucose, Bld: 92 mg/dL (ref 65–99)
POTASSIUM: 3.4 mmol/L — AB (ref 3.5–5.1)
SODIUM: 136 mmol/L (ref 135–145)

## 2017-05-25 LAB — CBC WITH DIFFERENTIAL/PLATELET
BASOS ABS: 0 10*3/uL (ref 0.0–0.1)
Basophils Relative: 0 %
EOS PCT: 4 %
Eosinophils Absolute: 0.3 10*3/uL (ref 0.0–0.7)
HCT: 24.4 % — ABNORMAL LOW (ref 39.0–52.0)
HEMOGLOBIN: 7.5 g/dL — AB (ref 13.0–17.0)
LYMPHS ABS: 1.9 10*3/uL (ref 0.7–4.0)
LYMPHS PCT: 22 %
MCH: 28.8 pg (ref 26.0–34.0)
MCHC: 30.7 g/dL (ref 30.0–36.0)
MCV: 93.8 fL (ref 78.0–100.0)
Monocytes Absolute: 0.8 10*3/uL (ref 0.1–1.0)
Monocytes Relative: 9 %
NEUTROS ABS: 5.3 10*3/uL (ref 1.7–7.7)
NEUTROS PCT: 65 %
PLATELETS: 131 10*3/uL — AB (ref 150–400)
RBC: 2.6 MIL/uL — AB (ref 4.22–5.81)
RDW: 17.7 % — ABNORMAL HIGH (ref 11.5–15.5)
WBC: 8.3 10*3/uL (ref 4.0–10.5)

## 2017-05-25 LAB — URINE CULTURE: Culture: NO GROWTH

## 2017-05-25 LAB — PTH, INTACT AND CALCIUM
Calcium, Total (PTH): 6.7 mg/dL — CL (ref 8.6–10.2)
PTH: 325 pg/mL — ABNORMAL HIGH (ref 15–65)

## 2017-05-25 LAB — VANCOMYCIN, TROUGH: Vancomycin Tr: 18 ug/mL (ref 15–20)

## 2017-05-25 LAB — MAGNESIUM: Magnesium: 1.5 mg/dL — ABNORMAL LOW (ref 1.7–2.4)

## 2017-05-25 NOTE — Progress Notes (Signed)
Subjective:     Good UOP noted in foley. Denies any SOB or chest pain S Creatinine slightly improved Patient is asking to discontinue fluid restrictions   Objective:  Vital signs in last 24 hours:    T 97.4  P 62  RR 16  BP 101/52  Intake/Output:    Physical Exam: General: NAD,    Head/ENT Normocephalic, moist oral mucus membranes  Eyes:  anicteric  Neck: supple  Lungs:  coarse at bases  Heart: Regular, no rub  Abdomen:   soft, obese, non tender  Extremities:  ++ b/l chronic edema, both feet in bandage  Neurologic: Alert, able to answer questions  Skin: Chronic statis dermatitis  Foley present    Basic Metabolic Panel:  Recent Labs Lab 05/20/17 0637 05/21/17 1420 05/23/17 0655 05/24/17 0500 05/24/17 0701 05/25/17 0707  NA 136 137 136 137  --  136  K 3.3* 3.0* 3.6 2.9*  --  3.4*  CL 102 103 103 101  --  100*  CO2 25 24 25 25   --  27  GLUCOSE 88 114* 114* 119*  --  92  BUN 65* 71* 69* 60*  --  53*  CREATININE 3.95* 4.20* 4.19* 4.07*  --  3.90*  CALCIUM 6.7* 6.8* 6.6* 6.7* 6.7* 7.2*  MG 1.3*  --   --   --   --  1.5*  PHOS  --  6.3* 6.1* 5.4*  --   --    Lab Results  Component Value Date   PTH 325 (H) 05/24/2017   PTH Comment 05/24/2017   CALCIUM 7.2 (L) 05/25/2017   CAION 1.13 10/15/2011   PHOS 5.4 (H) 05/24/2017    CBC:  Recent Labs Lab 05/20/17 0637 05/25/17 0707  WBC 8.8 8.3  NEUTROABS  --  5.3  HGB 8.5* 7.5*  HCT 26.9* 24.4*  MCV 92.1 93.8  PLT 128* 131*     Microbiology:  Recent Results (from the past 240 hour(s))  Culture, Urine     Status: None   Collection Time: 05/24/17  4:30 PM  Result Value Ref Range Status   Specimen Description URINE, RANDOM  Final   Special Requests NONE  Final   Culture NO GROWTH  Final   Report Status 05/25/2017 FINAL  Final     No results found for: HEPBSAG, HEPBSAB, HEPBIGM  Imaging: No results found.   Medications:    Assessment/ Plan:  61 y.o. caucasian male  with morbid obesity,  alcohol abuse, ambulatory dysfunction, who was admitted to [facility] on 05/19/2017 for ongoing treatment of severe sacral decubitus as well as wet gangrene of the right foot associated with sepsis and osteomyelitis.   1.  Acute renal failure, etiology currently unclear, but [facility] hospital felt this was ATN.  2.  Chronic kidney disease unspecified (baseline Cr unknown at this time.) Cr 1.1 in 08/2014 3.  Anemia of CKD.  4.  Osteomyelitis of the right foot. 5.  Hypocalcemia and hyperphosphatemia 6.  SHPTH 7. Hypokalemia  The patient did end up requiring dialysis at the outside hospital for acute renal failure. His temporary dialysis catheter was removed on 05/16/17.  UOP 6200 cc. No urgent indication for dialysis at the moment.  At present, he has good uop. S Creatinine is about the same. Electrolytes and Volume status are acceptable No acute indication for Dialysis at present. Abx for Osteomyelitis as per primary team. Dosing assistance from pharmacy.  Continue Calcium acetate and small dose of Calcitriol Will continue  to monitor closely.   D/c fluid restriction as UOP is good Start spironolactone for hypokalemia    LOS: 0 Frances Joynt 6/6/20185:07 PM

## 2017-05-26 ENCOUNTER — Other Ambulatory Visit (HOSPITAL_COMMUNITY): Payer: Medicare Other

## 2017-05-26 LAB — PROTEIN ELECTROPHORESIS, SERUM
A/G Ratio: 0.6 — ABNORMAL LOW (ref 0.7–1.7)
ALBUMIN ELP: 2 g/dL — AB (ref 2.9–4.4)
Alpha-1-Globulin: 0.2 g/dL (ref 0.0–0.4)
Alpha-2-Globulin: 0.6 g/dL (ref 0.4–1.0)
BETA GLOBULIN: 0.8 g/dL (ref 0.7–1.3)
GAMMA GLOBULIN: 1.4 g/dL (ref 0.4–1.8)
Globulin, Total: 3.1 g/dL (ref 2.2–3.9)
Total Protein ELP: 5.1 g/dL — ABNORMAL LOW (ref 6.0–8.5)

## 2017-05-26 LAB — POTASSIUM: Potassium: 3.1 mmol/L — ABNORMAL LOW (ref 3.5–5.1)

## 2017-05-26 LAB — MAGNESIUM: Magnesium: 1.9 mg/dL (ref 1.7–2.4)

## 2017-05-27 NOTE — Progress Notes (Signed)
Subjective:  [name]. However on 05/25/2017 creatinine was down to 3.9 Patient resting comfortably in bed today. No acute complaints at the moment.   Objective:  Vital signs in last 24 hours:  Temperature 97.1 pulse 76 respirations 31 blood pressure 127/58    Physical Exam: General: NAD  Head/ENT Normocephalic, moist oral mucus membranes  Eyes: anicteric  Neck: supple  Lungs:  Scattered rhonchi, normal effort  Heart: Regular, no rub  Abdomen:   soft, obese, non tender  Extremities:  ++ b/l chronic edema, both feet in bandage  Neurologic: Alert, able to answer questions  Skin: Chronic statis dermatitis  Foley present    Basic Metabolic Panel:  Recent Labs Lab 05/21/17 1420 05/23/17 0655 05/24/17 0500 05/24/17 0701 05/25/17 0707 05/26/17 0500  NA 137 136 137  --  136  --   K 3.0* 3.6 2.9*  --  3.4* 3.1*  CL 103 103 101  --  100*  --   CO2 24 25 25   --  27  --   GLUCOSE 114* 114* 119*  --  92  --   BUN 71* 69* 60*  --  53*  --   CREATININE 4.20* 4.19* 4.07*  --  3.90*  --   CALCIUM 6.8* 6.6* 6.7* 6.7* 7.2*  --   MG  --   --   --   --  1.5* 1.9  PHOS 6.3* 6.1* 5.4*  --   --   --    Lab Results  Component Value Date   PTH 325 (H) 05/24/2017   PTH Comment 05/24/2017   CALCIUM 7.2 (L) 05/25/2017   CAION 1.13 10/15/2011   PHOS 5.4 (H) 05/24/2017    CBC:  Recent Labs Lab 05/25/17 0707  WBC 8.3  NEUTROABS 5.3  HGB 7.5*  HCT 24.4*  MCV 93.8  PLT 131*     Microbiology:  Recent Results (from the past 240 hour(s))  Culture, Urine     Status: None   Collection Time: 05/24/17  4:30 PM  Result Value Ref Range Status   Specimen Description URINE, RANDOM  Final   Special Requests NONE  Final   Culture NO GROWTH  Final   Report Status 05/25/2017 FINAL  Final     No results found for: HEPBSAG, HEPBSAB, HEPBIGM  Imaging: US Renal  Result Date: 05/26/2017 CLINICAL DATA:  Hematuria EXAM: RENAL / URINARY TRACT ULTRASOUND  COMPLETE COMPARISON:  May 20, 2017 FINDINGS: Right Kidney: Length: 11.5 cm. Echogenicity and renal cortical thickness are within normal limits. No mass, perinephric fluid, or hydronephrosis visualized. No sonographically demonstrable calculus or ureterectasis. Left Kidney: Length: 13.4 cm. Echogenicity and renal cortical thickness are within normal limits. No mass, perinephric fluid, or hydronephrosis visualized. No sonographically demonstrable calculus or ureterectasis. Bladder: Appears normal for degree of bladder distention. IMPRESSION: Study within normal limits. Electronically Signed   By: Lowella Grip III M.D.   On: 05/26/2017 16:10     Medications:    Assessment/ Plan:  61 y.o. caucasian male  with morbid obesity, alcohol abuse, ambulatory dysfunction, who was admitted to [facility] on 05/19/2017 for ongoing treatment of severe sacral decubitus as well as wet gangrene of the right foot associated with sepsis and osteomyelitis.   1.  Acute renal failure, etiology currently unclear, but [name] hospital felt this was ATN.  2.  Chronic kidney disease unspecified (baseline Cr unknown at this time.) Cr 1.1 in 08/2014 3.  Anemia of CKD.  4.  Osteomyelitis of the right foot. 5.  Hypocalcemia and hyperphosphatemia 6.  SHPTH 7. Hypokalemia  - No new renal function testing available at the moment we recommend followup renal function either today or tomorrow. No indication for dialysis the moment as the patient does have good urine output. He may be left with significant renal dysfunction however. We will continue to monitor her his renal progress over the course of his hospitalization.Otherwise management as per hospitalist.   LOS: 0 Chad Schaefer 6/8/20183:34 PM

## 2017-05-29 LAB — MAGNESIUM: MAGNESIUM: 1.7 mg/dL (ref 1.7–2.4)

## 2017-05-29 LAB — BASIC METABOLIC PANEL
ANION GAP: 10 (ref 5–15)
BUN: 71 mg/dL — AB (ref 6–20)
CALCIUM: 7.5 mg/dL — AB (ref 8.9–10.3)
CO2: 25 mmol/L (ref 22–32)
Chloride: 100 mmol/L — ABNORMAL LOW (ref 101–111)
Creatinine, Ser: 3.9 mg/dL — ABNORMAL HIGH (ref 0.61–1.24)
GFR calc Af Amer: 18 mL/min — ABNORMAL LOW (ref >60)
GFR, EST NON AFRICAN AMERICAN: 15 mL/min — AB (ref >60)
GLUCOSE: 101 mg/dL — AB (ref 65–99)
POTASSIUM: 3.5 mmol/L (ref 3.5–5.1)
SODIUM: 135 mmol/L (ref 135–145)

## 2017-05-30 LAB — CBC
HEMATOCRIT: 20.1 % — AB (ref 39.0–52.0)
HEMOGLOBIN: 6.3 g/dL — AB (ref 13.0–17.0)
MCH: 29.3 pg (ref 26.0–34.0)
MCHC: 31.3 g/dL (ref 30.0–36.0)
MCV: 93.5 fL (ref 78.0–100.0)
Platelets: 155 10*3/uL (ref 150–400)
RBC: 2.15 MIL/uL — ABNORMAL LOW (ref 4.22–5.81)
RDW: 17.8 % — AB (ref 11.5–15.5)
WBC: 7.8 10*3/uL (ref 4.0–10.5)

## 2017-05-30 LAB — ABO/RH: ABO/RH(D): A POS

## 2017-05-30 LAB — PREPARE RBC (CROSSMATCH)

## 2017-05-30 LAB — HEMOGLOBIN: Hemoglobin: 6.5 g/dL — CL (ref 13.0–17.0)

## 2017-05-30 NOTE — Progress Notes (Signed)
  Subjective:  Hemoglobin down to 6.5. Patient given 1 unit PRBC transfusion. Creatinine yesterday was 3.9.   Objective:  Vital signs in last 24 hours:  Temperature 97.1 pulse 76 respirations 31 blood pressure 127/58    Physical Exam: General: NAD  Head/ENT Normocephalic, moist oral mucus membranes  Eyes: anicteric  Neck: supple  Lungs:  Scattered rhonchi, normal effort  Heart: Regular, no rub  Abdomen:   soft, obese, non tender  Extremities: ++ b/l chronic edema, both feet in bandage  Neurologic: Alert, able to answer questions  Skin: Chronic statis dermatitis       Basic Metabolic Panel:  Recent Labs Lab 05/24/17 0500 05/24/17 0701 05/25/17 0707 05/26/17 0500 05/29/17 0654  NA 137  --  136  --  135  K 2.9*  --  3.4* 3.1* 3.5  CL 101  --  100*  --  100*  CO2 25  --  27  --  25  GLUCOSE 119*  --  92  --  101*  BUN 60*  --  53*  --  71*  CREATININE 4.07*  --  3.90*  --  3.90*  CALCIUM 6.7* 6.7* 7.2*  --  7.5*  MG  --   --  1.5* 1.9 1.7  PHOS 5.4*  --   --   --   --    Lab Results  Component Value Date   PTH 325 (H) 05/24/2017   PTH Comment 05/24/2017   CALCIUM 7.5 (L) 05/29/2017   CAION 1.13 10/15/2011   PHOS 5.4 (H) 05/24/2017    CBC:  Recent Labs Lab 05/25/17 0707 05/30/17 0623 05/30/17 0648  WBC 8.3 7.8  --   NEUTROABS 5.3  --   --   HGB 7.5* 6.3* 6.5*  HCT 24.4* 20.1*  --   MCV 93.8 93.5  --   PLT 131* 155  --      Microbiology:  Recent Results (from the past 240 hour(s))  Culture, Urine     Status: None   Collection Time: 05/24/17  4:30 PM  Result Value Ref Range Status   Specimen Description URINE, RANDOM  Final   Special Requests NONE  Final   Culture NO GROWTH  Final   Report Status 05/25/2017 FINAL  Final     No results found for: HEPBSAG, HEPBSAB, HEPBIGM  Imaging: No results found.   Medications:    Assessment/ Plan:  61 y.o. caucasian male  with morbid obesity, alcohol abuse, ambulatory dysfunction, who was  admitted to [facility] on 05/19/2017 for ongoing treatment of severe sacral decubitus as well as wet gangrene of the right foot associated with sepsis and osteomyelitis.   1.  Acute renal failure, etiology currently unclear, but [facility] hospital felt this was ATN.  2.  Chronic kidney disease unspecified (baseline Cr unknown at this time.) Cr 1.1 in 08/2014 3.  Anemia of CKD.  4.  Osteomyelitis of the right foot. 5.  Hypocalcemia and hyperphosphatemia 6.  SHPTH 7. Hypokalemia  Plan:  Patient is still making urine however his overall renal functionremains low with an EGFR of 15.however no urgent indication for dialysis at the moment. BUN currently 71 with a creatinine of 3.9. Continue to periodically monitor renal function. Hemoglobin was down to 6.5 however patient did receive blood transfusion today. Continue to monitor CBC as well. Otherwise management as per hospitalist.   LOS: 0 Lidwina Kaner 6/11/20185:27 PM

## 2017-05-31 LAB — CBC
HCT: 23.6 % — ABNORMAL LOW (ref 39.0–52.0)
Hemoglobin: 7.4 g/dL — ABNORMAL LOW (ref 13.0–17.0)
MCH: 29.8 pg (ref 26.0–34.0)
MCHC: 31.4 g/dL (ref 30.0–36.0)
MCV: 95.2 fL (ref 78.0–100.0)
PLATELETS: 175 10*3/uL (ref 150–400)
RBC: 2.48 MIL/uL — AB (ref 4.22–5.81)
RDW: 17.8 % — AB (ref 11.5–15.5)
WBC: 7.1 10*3/uL (ref 4.0–10.5)

## 2017-05-31 LAB — BASIC METABOLIC PANEL
ANION GAP: 10 (ref 5–15)
BUN: 75 mg/dL — ABNORMAL HIGH (ref 6–20)
CALCIUM: 7.9 mg/dL — AB (ref 8.9–10.3)
CO2: 26 mmol/L (ref 22–32)
Chloride: 102 mmol/L (ref 101–111)
Creatinine, Ser: 4.18 mg/dL — ABNORMAL HIGH (ref 0.61–1.24)
GFR, EST AFRICAN AMERICAN: 16 mL/min — AB (ref >60)
GFR, EST NON AFRICAN AMERICAN: 14 mL/min — AB (ref >60)
Glucose, Bld: 93 mg/dL (ref 65–99)
Potassium: 3.5 mmol/L (ref 3.5–5.1)
Sodium: 138 mmol/L (ref 135–145)

## 2017-05-31 LAB — PHOSPHORUS: PHOSPHORUS: 7.6 mg/dL — AB (ref 2.5–4.6)

## 2017-05-31 LAB — VANCOMYCIN, TROUGH: VANCOMYCIN TR: 17 ug/mL (ref 15–20)

## 2017-05-31 LAB — MAGNESIUM: MAGNESIUM: 1.8 mg/dL (ref 1.7–2.4)

## 2017-06-01 LAB — CBC WITH DIFFERENTIAL/PLATELET
Basophils Absolute: 0 10*3/uL (ref 0.0–0.1)
Basophils Relative: 1 %
EOS ABS: 0.3 10*3/uL (ref 0.0–0.7)
Eosinophils Relative: 4 %
HCT: 22.2 % — ABNORMAL LOW (ref 39.0–52.0)
Hemoglobin: 7 g/dL — ABNORMAL LOW (ref 13.0–17.0)
LYMPHS ABS: 2 10*3/uL (ref 0.7–4.0)
Lymphocytes Relative: 26 %
MCH: 29.8 pg (ref 26.0–34.0)
MCHC: 31.5 g/dL (ref 30.0–36.0)
MCV: 94.5 fL (ref 78.0–100.0)
MONOS PCT: 12 %
Monocytes Absolute: 0.9 10*3/uL (ref 0.1–1.0)
NEUTROS PCT: 57 %
Neutro Abs: 4.5 10*3/uL (ref 1.7–7.7)
Platelets: 182 10*3/uL (ref 150–400)
RBC: 2.35 MIL/uL — ABNORMAL LOW (ref 4.22–5.81)
RDW: 18.1 % — ABNORMAL HIGH (ref 11.5–15.5)
WBC: 7.8 10*3/uL (ref 4.0–10.5)

## 2017-06-01 LAB — RENAL FUNCTION PANEL
ANION GAP: 10 (ref 5–15)
Albumin: 2 g/dL — ABNORMAL LOW (ref 3.5–5.0)
BUN: 73 mg/dL — ABNORMAL HIGH (ref 6–20)
CHLORIDE: 101 mmol/L (ref 101–111)
CO2: 25 mmol/L (ref 22–32)
CREATININE: 4.42 mg/dL — AB (ref 0.61–1.24)
Calcium: 7.8 mg/dL — ABNORMAL LOW (ref 8.9–10.3)
GFR calc Af Amer: 15 mL/min — ABNORMAL LOW (ref >60)
GFR calc non Af Amer: 13 mL/min — ABNORMAL LOW (ref >60)
Glucose, Bld: 133 mg/dL — ABNORMAL HIGH (ref 65–99)
Phosphorus: 7.2 mg/dL — ABNORMAL HIGH (ref 2.5–4.6)
Potassium: 3.3 mmol/L — ABNORMAL LOW (ref 3.5–5.1)
Sodium: 136 mmol/L (ref 135–145)

## 2017-06-01 LAB — MAGNESIUM: MAGNESIUM: 1.8 mg/dL (ref 1.7–2.4)

## 2017-06-01 LAB — PREPARE RBC (CROSSMATCH)

## 2017-06-01 NOTE — Progress Notes (Signed)
  Subjective:  Hemoglobin dropped down to 7.0. Patient being given another unit of blood today. Creatinine slowly worsening. Currently creatinine up to 4.4. Patient is making good urine output however.   Objective:  Vital signs in last 24 hours:  Temperature 97.1 pulse 76 respirations 31 blood pressure 127/58    Physical Exam: General: Morbidly obese male, no acute distress  Head/ENT Normocephalic, moist oral mucus membranes  Eyes: anicteric  Neck: supple  Lungs:  Clear to auscultation bilateral normal effort  Heart: Regular, no rub  Abdomen:   soft, obese, non tender  Extremities: ++ b/l chronic edema  Neurologic: Alert, able to answer questions  Skin: Chronic statis dermatitis       Basic Metabolic Panel:  Recent Labs Lab 05/26/17 0500 05/29/17 0654 05/31/17 0600 06/01/17 0940  NA  --  135 138 136  K 3.1* 3.5 3.5 3.3*  CL  --  100* 102 101  CO2  --  25 26 25   GLUCOSE  --  101* 93 133*  BUN  --  71* 75* 73*  CREATININE  --  3.90* 4.18* 4.42*  CALCIUM  --  7.5* 7.9* 7.8*  MG 1.9 1.7 1.8 1.8  PHOS  --   --  7.6* 7.2*   Lab Results  Component Value Date   PTH 325 (H) 05/24/2017   PTH Comment 05/24/2017   CALCIUM 7.8 (L) 06/01/2017   CAION 1.13 10/15/2011   PHOS 7.2 (H) 06/01/2017    CBC:  Recent Labs Lab 05/30/17 0623 05/30/17 0648 05/31/17 0600 06/01/17 0940  WBC 7.8  --  7.1 7.8  NEUTROABS  --   --   --  4.5  HGB 6.3* 6.5* 7.4* 7.0*  HCT 20.1*  --  23.6* 22.2*  MCV 93.5  --  95.2 94.5  PLT 155  --  175 182     Microbiology:  Recent Results (from the past 240 hour(s))  Culture, Urine     Status: None   Collection Time: 05/24/17  4:30 PM  Result Value Ref Range Status   Specimen Description URINE, RANDOM  Final   Special Requests NONE  Final   Culture NO GROWTH  Final   Report Status 05/25/2017 FINAL  Final     No results found for: HEPBSAG, HEPBSAB, HEPBIGM  Imaging: No results found.   Medications:    Assessment/ Plan:  61  y.o. caucasian male  with morbid obesity, alcohol abuse, ambulatory dysfunction, who was admitted to [facility] on 05/19/2017 for ongoing treatment of severe sacral decubitus as well as wet gangrene of the right foot associated with sepsis and osteomyelitis.   1.  Acute renal failure, etiology currently unclear, but [facility] hospital felt this was ATN. Negative ANA/SPEP/ANCA 2.  Chronic kidney disease unspecified (baseline Cr unknown at this time.) Cr 1.1 in 08/2014 3.  Anemia of CKD.  4.  Osteomyelitis of the right foot. 5.  Hypocalcemia and hyperphosphatemia 6.  SHPTH 7. Hypokalemia  Plan:  Renal function worse over the past 2 days. Again exact etiology of his acute renal failure unclear. He did have a renal ultrasound earlier in the course of hospitalization which was negative for hydronephrosis. Serologic work up also negative.  For now continue to monitor renal function daily.  If renal function continues to worse we may need to consider dialysis.  Agree with blood transfusion.     LOS: 0 Bellah Alia 6/13/20182:40 PM

## 2017-06-02 LAB — TYPE AND SCREEN
ABO/RH(D): A POS
Antibody Screen: NEGATIVE
Unit division: 0
Unit division: 0

## 2017-06-02 LAB — RENAL FUNCTION PANEL
ANION GAP: 11 (ref 5–15)
Albumin: 1.9 g/dL — ABNORMAL LOW (ref 3.5–5.0)
BUN: 72 mg/dL — ABNORMAL HIGH (ref 6–20)
CO2: 26 mmol/L (ref 22–32)
Calcium: 7.7 mg/dL — ABNORMAL LOW (ref 8.9–10.3)
Chloride: 101 mmol/L (ref 101–111)
Creatinine, Ser: 4.47 mg/dL — ABNORMAL HIGH (ref 0.61–1.24)
GFR calc Af Amer: 15 mL/min — ABNORMAL LOW (ref >60)
GFR calc non Af Amer: 13 mL/min — ABNORMAL LOW (ref >60)
GLUCOSE: 95 mg/dL (ref 65–99)
POTASSIUM: 3.2 mmol/L — AB (ref 3.5–5.1)
Phosphorus: 7.3 mg/dL — ABNORMAL HIGH (ref 2.5–4.6)
SODIUM: 138 mmol/L (ref 135–145)

## 2017-06-02 LAB — CBC
HEMATOCRIT: 23.3 % — AB (ref 39.0–52.0)
HEMOGLOBIN: 7.4 g/dL — AB (ref 13.0–17.0)
MCH: 30 pg (ref 26.0–34.0)
MCHC: 31.8 g/dL (ref 30.0–36.0)
MCV: 94.3 fL (ref 78.0–100.0)
Platelets: 173 10*3/uL (ref 150–400)
RBC: 2.47 MIL/uL — ABNORMAL LOW (ref 4.22–5.81)
RDW: 18 % — ABNORMAL HIGH (ref 11.5–15.5)
WBC: 7.8 10*3/uL (ref 4.0–10.5)

## 2017-06-02 LAB — BPAM RBC
BLOOD PRODUCT EXPIRATION DATE: 201806232359
Blood Product Expiration Date: 201806272359
ISSUE DATE / TIME: 201806131353
ISSUE DATE / TIME: 201806111630
UNIT TYPE AND RH: 6200
Unit Type and Rh: 6200

## 2017-06-02 LAB — C-REACTIVE PROTEIN: CRP: 10.8 mg/dL — ABNORMAL HIGH (ref <1.0)

## 2017-06-02 LAB — SEDIMENTATION RATE: Sed Rate: 104 mm/hr — ABNORMAL HIGH (ref 0–16)

## 2017-06-03 LAB — CBC
HCT: 23.9 % — ABNORMAL LOW (ref 39.0–52.0)
HEMOGLOBIN: 7.5 g/dL — AB (ref 13.0–17.0)
MCH: 29.4 pg (ref 26.0–34.0)
MCHC: 31.4 g/dL (ref 30.0–36.0)
MCV: 93.7 fL (ref 78.0–100.0)
PLATELETS: 188 10*3/uL (ref 150–400)
RBC: 2.55 MIL/uL — ABNORMAL LOW (ref 4.22–5.81)
RDW: 18.1 % — ABNORMAL HIGH (ref 11.5–15.5)
WBC: 7.7 10*3/uL (ref 4.0–10.5)

## 2017-06-03 LAB — RENAL FUNCTION PANEL
Albumin: 1.9 g/dL — ABNORMAL LOW (ref 3.5–5.0)
Anion gap: 12 (ref 5–15)
BUN: 69 mg/dL — AB (ref 6–20)
CALCIUM: 7.9 mg/dL — AB (ref 8.9–10.3)
CHLORIDE: 99 mmol/L — AB (ref 101–111)
CO2: 26 mmol/L (ref 22–32)
CREATININE: 4.28 mg/dL — AB (ref 0.61–1.24)
GFR calc Af Amer: 16 mL/min — ABNORMAL LOW (ref >60)
GFR calc non Af Amer: 14 mL/min — ABNORMAL LOW (ref >60)
GLUCOSE: 91 mg/dL (ref 65–99)
Phosphorus: 7.1 mg/dL — ABNORMAL HIGH (ref 2.5–4.6)
Potassium: 3.7 mmol/L (ref 3.5–5.1)
SODIUM: 137 mmol/L (ref 135–145)

## 2017-06-03 LAB — MAGNESIUM: MAGNESIUM: 1.9 mg/dL (ref 1.7–2.4)

## 2017-06-03 NOTE — Progress Notes (Signed)
Subjective:  bUN and creatinine remained high though slightly lower. BUN currently 69 with a creatinine of 4.28. Hemoglobin stable at 7.5. Patient still making urine.    Objective:  Vital signs in last 24 hours:  Temperature 97.4 pulse 72 respirations 16 blood pressure 98/56    Physical Exam: General: Morbidly obese male, no acute distress  Head/ENT Normocephalic, moist oral mucus membranes  Eyes: anicteric  Neck: supple  Lungs:  Clear to auscultation bilateral normal effort  Heart: Regular, no rub  Abdomen:   soft, obese, non tender  Extremities: ++ b/l chronic edema  Neurologic: Alert, able to answer questions  Skin: Chronic statis dermatitis       Basic Metabolic Panel:  Recent Labs Lab 05/29/17 0654 05/31/17 0600 06/01/17 0940 06/02/17 0540 06/03/17 0500  NA 135 138 136 138 137  K 3.5 3.5 3.3* 3.2* 3.7  CL 100* 102 101 101 99*  CO2 25 26 25 26 26   GLUCOSE 101* 93 133* 95 91  BUN 71* 75* 73* 72* 69*  CREATININE 3.90* 4.18* 4.42* 4.47* 4.28*  CALCIUM 7.5* 7.9* 7.8* 7.7* 7.9*  MG 1.7 1.8 1.8  --  1.9  PHOS  --  7.6* 7.2* 7.3* 7.1*   Lab Results  Component Value Date   PTH 325 (H) 05/24/2017   PTH Comment 05/24/2017   CALCIUM 7.9 (L) 06/03/2017   CAION 1.13 10/15/2011   PHOS 7.1 (H) 06/03/2017    CBC:  Recent Labs Lab 05/30/17 0623 05/30/17 0648 05/31/17 0600 06/01/17 0940 06/02/17 0540 06/03/17 0500  WBC 7.8  --  7.1 7.8 7.8 7.7  NEUTROABS  --   --   --  4.5  --   --   HGB 6.3* 6.5* 7.4* 7.0* 7.4* 7.5*  HCT 20.1*  --  23.6* 22.2* 23.3* 23.9*  MCV 93.5  --  95.2 94.5 94.3 93.7  PLT 155  --  175 182 173 188     Microbiology:  Recent Results (from the past 240 hour(s))  Culture, Urine     Status: None   Collection Time: 05/24/17  4:30 PM  Result Value Ref Range Status   Specimen Description URINE, RANDOM  Final   Special Requests NONE  Final   Culture NO GROWTH  Final   Report Status 05/25/2017 FINAL  Final     No results found  for: HEPBSAG, HEPBSAB, HEPBIGM  Imaging: No results found.   Medications:    Assessment/ Plan:  61 y.o. caucasian male  with morbid obesity, alcohol abuse, ambulatory dysfunction, who was admitted to [facility] on 05/19/2017 for ongoing treatment of severe sacral decubitus as well as wet gangrene of the right foot associated with sepsis and osteomyelitis.   1.  Acute renal failure, etiology currently unclear, but [facility] hospital felt this was ATN. Negative ANA/SPEP/ANCA 2.  Chronic kidney disease unspecified (baseline Cr unknown at this time.) Cr 1.1 in 08/2014 3.  Anemia of CKD.  4.  Osteomyelitis of the right foot. 5.  Hypocalcemia and hyperphosphatemia 6.  SHPTH 7. Hypokalemia  Plan:  Patient's renal function is minimally improved today. BUN currently 69 with a creatinine of 4.28. Patient continues to have good urine output however. Her for more urgent indication for dialysis. As before patient previously had normal renal ultrasound and negative serologic workup. Would not recommend renal biopsy at this time. Continue to monitor CBC given ongoing anemia. He previously required blood transfusion.We will continue to follow his progress.   LOS: 0 Bennie Chirico  6/15/20183:30 PM

## 2017-06-05 LAB — HEMOGLOBIN A1C
Hgb A1c MFr Bld: 5.3 % (ref 4.8–5.6)
Mean Plasma Glucose: 105 mg/dL

## 2017-06-06 LAB — RENAL FUNCTION PANEL
ANION GAP: 11 (ref 5–15)
Albumin: 2 g/dL — ABNORMAL LOW (ref 3.5–5.0)
BUN: 72 mg/dL — AB (ref 6–20)
CHLORIDE: 99 mmol/L — AB (ref 101–111)
CO2: 25 mmol/L (ref 22–32)
Calcium: 8.1 mg/dL — ABNORMAL LOW (ref 8.9–10.3)
Creatinine, Ser: 4.24 mg/dL — ABNORMAL HIGH (ref 0.61–1.24)
GFR calc Af Amer: 16 mL/min — ABNORMAL LOW (ref >60)
GFR, EST NON AFRICAN AMERICAN: 14 mL/min — AB (ref >60)
Glucose, Bld: 92 mg/dL (ref 65–99)
POTASSIUM: 3.5 mmol/L (ref 3.5–5.1)
Phosphorus: 7 mg/dL — ABNORMAL HIGH (ref 2.5–4.6)
Sodium: 135 mmol/L (ref 135–145)

## 2017-06-06 LAB — CBC
HCT: 23.9 % — ABNORMAL LOW (ref 39.0–52.0)
Hemoglobin: 7.6 g/dL — ABNORMAL LOW (ref 13.0–17.0)
MCH: 30.4 pg (ref 26.0–34.0)
MCHC: 31.8 g/dL (ref 30.0–36.0)
MCV: 95.6 fL (ref 78.0–100.0)
PLATELETS: 209 10*3/uL (ref 150–400)
RBC: 2.5 MIL/uL — ABNORMAL LOW (ref 4.22–5.81)
RDW: 18.1 % — AB (ref 11.5–15.5)
WBC: 6.9 10*3/uL (ref 4.0–10.5)

## 2017-06-06 LAB — VANCOMYCIN, TROUGH: VANCOMYCIN TR: 21 ug/mL — AB (ref 15–20)

## 2017-06-06 LAB — MAGNESIUM: Magnesium: 1.7 mg/dL (ref 1.7–2.4)

## 2017-06-06 NOTE — Progress Notes (Signed)
  Subjective:  Patient continues to have elevated BUN and creatinine. BUN currently 72 with a creatinine of 4.24.  However patient had 1.2 L of urine output this a.m.    Objective:  Vital signs in last 24 hours:  Temperature 97.5 pulse 70 respirations 16 blood pressure 111/58    Physical Exam: General: Morbidly obese male, no acute distress  Head/ENT Normocephalic, moist oral mucus membranes  Eyes: anicteric  Neck: supple  Lungs:  Clear to auscultation bilateral normal effort  Heart: Regular, no rub  Abdomen:  soft, obese, non tender  Extremities: ++ b/l chronic edema  Neurologic: Alert, able to answer questions  Skin: Chronic statis dermatitis       Basic Metabolic Panel:  Recent Labs Lab 05/31/17 0600 06/01/17 0940 06/02/17 0540 06/03/17 0500 06/06/17 0600  NA 138 136 138 137 135  K 3.5 3.3* 3.2* 3.7 3.5  CL 102 101 101 99* 99*  CO2 '26 25 26 26 25  '$ GLUCOSE 93 133* 95 91 92  BUN 75* 73* 72* 69* 72*  CREATININE 4.18* 4.42* 4.47* 4.28* 4.24*  CALCIUM 7.9* 7.8* 7.7* 7.9* 8.1*  MG 1.8 1.8  --  1.9 1.7  PHOS 7.6* 7.2* 7.3* 7.1* 7.0*   Lab Results  Component Value Date   PTH 325 (H) 05/24/2017   PTH Comment 05/24/2017   CALCIUM 8.1 (L) 06/06/2017   CAION 1.13 10/15/2011   PHOS 7.0 (H) 06/06/2017    CBC:  Recent Labs Lab 05/31/17 0600 06/01/17 0940 06/02/17 0540 06/03/17 0500 06/06/17 0600  WBC 7.1 7.8 7.8 7.7 6.9  NEUTROABS  --  4.5  --   --   --   HGB 7.4* 7.0* 7.4* 7.5* 7.6*  HCT 23.6* 22.2* 23.3* 23.9* 23.9*  MCV 95.2 94.5 94.3 93.7 95.6  PLT 175 182 173 188 209     Microbiology:  No results found for this or any previous visit (from the past 240 hour(s)).   No results found for: HEPBSAG, HEPBSAB, HEPBIGM  Imaging: No results found.   Medications:    Assessment/ Plan:  61 y.o. caucasian male  with morbid obesity, alcohol abuse, ambulatory dysfunction, who was admitted to [facility] on 05/19/2017 for ongoing treatment  of severe sacral decubitus as well as wet gangrene of the right foot associated with sepsis and osteomyelitis.   1.  Acute renal failure, etiology currently unclear, but [facility] hospital felt this was ATN. Negative ANA/SPEP/ANCA 2.  Chronic kidney disease unspecified (baseline Cr unknown at this time.) Cr 1.1 in 08/2014 3.  Anemia of CKD.  4.  Osteomyelitis of the right foot. 5.  Hypocalcemia and hyperphosphatemia 6.  SHPTH 7. Hypokalemia  Plan:  atient continues to have significant renal dysfunction. Most recent EGFR was 14. Prior serologic workup was negative. However we can't totally exclude infection related glomerulonephritis.However treatment for this would be to treat the underlying infection. No urgent indication for dialysis at the moment. Continue to monitor renal function as well as urine output.  Hemoglobin remains low at 7.6. No indication for transfusion at the moment. Start renvela 2 tabs po tid/wm.  We will continue to monitor along with you.   LOS: 0 Richell Corker 6/18/20184:07 PM

## 2017-06-07 LAB — VANCOMYCIN, TROUGH: Vancomycin Tr: 15 ug/mL (ref 15–20)

## 2017-06-08 NOTE — Progress Notes (Signed)
  Subjective:  [name]. Our patient continues to have excellent urine output. Serum potassium currently 3.5. Discussed care with wound nurse. It appears that his right foot is healing.    Objective:  Vital signs in last 24 hours:  Temperature 97.1 pulse 70 respirations 16 blood pressure 106/54    Physical Exam: General: Morbidly obese male, no acute distress  Head/ENT Normocephalic, moist oral mucus membranes  Eyes: anicteric  Neck: supple  Lungs:  Clear to auscultation bilateral normal effort  Heart: Regular, no rub  Abdomen:  soft, obese, non tender  Extremities: ++ b/l chronic edema, both feet wrapped  Neurologic: Alert, able to answer questions  Skin: Chronic statis dermatitis       Basic Metabolic Panel:  Recent Labs Lab 06/02/17 0540 06/03/17 0500 06/06/17 0600  NA 138 137 135  K 3.2* 3.7 3.5  CL 101 99* 99*  CO2 26 26 25   GLUCOSE 95 91 92  BUN 72* 69* 72*  CREATININE 4.47* 4.28* 4.24*  CALCIUM 7.7* 7.9* 8.1*  MG  --  1.9 1.7  PHOS 7.3* 7.1* 7.0*   Lab Results  Component Value Date   PTH 325 (H) 05/24/2017   PTH Comment 05/24/2017   CALCIUM 8.1 (L) 06/06/2017   CAION 1.13 10/15/2011   PHOS 7.0 (H) 06/06/2017    CBC:  Recent Labs Lab 06/02/17 0540 06/03/17 0500 06/06/17 0600  WBC 7.8 7.7 6.9  HGB 7.4* 7.5* 7.6*  HCT 23.3* 23.9* 23.9*  MCV 94.3 93.7 95.6  PLT 173 188 209     Microbiology:  No results found for this or any previous visit (from the past 240 hour(s)).   No results found for: HEPBSAG, HEPBSAB, HEPBIGM  Imaging: No results found.   Medications:    Assessment/ Plan:  61 y.o. caucasian male  with morbid obesity, alcohol abuse, ambulatory dysfunction, who was admitted to [facility] on 05/19/2017 for ongoing treatment of severe sacral decubitus as well as wet gangrene of the right foot associated with sepsis and osteomyelitis.   1.  Acute renal failure, etiology currently  unclear, but [facility] hospital felt this was ATN. Negative ANA/SPEP/ANCA 2.  Chronic kidney disease unspecified (baseline Cr unknown at this time.) Cr 1.1 in 08/2014 3.  Anemia of CKD.  4.  Osteomyelitis of the right foot. 5.  Hypocalcemia and hyperphosphatemia 6.  SHPTH 7. Hypokalemia  Plan:  [name]. Recommend periodic renal function testing. However patient continues to have good urine output. As before at the outside hospital it was presumed that he had acute tubular necrosis. We performed so a workup and this was found to be negative. However as before we cannot totally exclude infection related glomerulonephritis. However at this time we would not recommend renal biopsy. Continue supportive care for now. No urgent indication for dialysis as the patient is a good urine output. Wound care as per hospitalist.   LOS: 0 Chad Schaefer 6/20/20183:27 PM

## 2017-06-09 LAB — BASIC METABOLIC PANEL
Anion gap: 11 (ref 5–15)
BUN: 64 mg/dL — AB (ref 6–20)
CALCIUM: 8.5 mg/dL — AB (ref 8.9–10.3)
CHLORIDE: 99 mmol/L — AB (ref 101–111)
CO2: 25 mmol/L (ref 22–32)
CREATININE: 4.01 mg/dL — AB (ref 0.61–1.24)
GFR calc non Af Amer: 15 mL/min — ABNORMAL LOW (ref >60)
GFR, EST AFRICAN AMERICAN: 17 mL/min — AB (ref >60)
Glucose, Bld: 123 mg/dL — ABNORMAL HIGH (ref 65–99)
Potassium: 3.3 mmol/L — ABNORMAL LOW (ref 3.5–5.1)
SODIUM: 135 mmol/L (ref 135–145)

## 2017-06-09 LAB — CBC
HEMATOCRIT: 26.8 % — AB (ref 39.0–52.0)
HEMOGLOBIN: 8.5 g/dL — AB (ref 13.0–17.0)
MCH: 29.7 pg (ref 26.0–34.0)
MCHC: 31.7 g/dL (ref 30.0–36.0)
MCV: 93.7 fL (ref 78.0–100.0)
Platelets: 242 10*3/uL (ref 150–400)
RBC: 2.86 MIL/uL — ABNORMAL LOW (ref 4.22–5.81)
RDW: 17.5 % — AB (ref 11.5–15.5)
WBC: 11 10*3/uL — ABNORMAL HIGH (ref 4.0–10.5)

## 2017-06-10 LAB — POTASSIUM: POTASSIUM: 3.5 mmol/L (ref 3.5–5.1)

## 2017-06-10 NOTE — Progress Notes (Signed)
  Subjective:  Creatinine has trended down slightly today to 4.01. BUN 64. Patient still has good urine output. He is resting comfortably in bed at the moment.    Objective:  Vital signs in last 24 hours:  Temperature 98.6 pulse 86 respirations 20 blood pressure 98/50    Physical Exam: General: Morbidly obese male, no acute distress  Head/ENT Normocephalic, moist oral mucus membranes  Eyes: anicteric  Neck: supple  Lungs:  Clear to auscultation bilateral normal effort  Heart: Regular, no rub  Abdomen:  soft, obese, non tender  Extremities: ++ b/l chronic edema, both feet wrapped  Neurologic: Alert, able to answer questions  Skin: Chronic stasis dermatitis       Basic Metabolic Panel:  Recent Labs Lab 06/06/17 0600 06/09/17 0500  NA 135 135  K 3.5 3.3*  CL 99* 99*  CO2 25 25  GLUCOSE 92 123*  BUN 72* 64*  CREATININE 4.24* 4.01*  CALCIUM 8.1* 8.5*  MG 1.7  --   PHOS 7.0*  --    Lab Results  Component Value Date   PTH 325 (H) 05/24/2017   PTH Comment 05/24/2017   CALCIUM 8.5 (L) 06/09/2017   CAION 1.13 10/15/2011   PHOS 7.0 (H) 06/06/2017    CBC:  Recent Labs Lab 06/06/17 0600 06/09/17 0500  WBC 6.9 11.0*  HGB 7.6* 8.5*  HCT 23.9* 26.8*  MCV 95.6 93.7  PLT 209 242     Microbiology:  No results found for this or any previous visit (from the past 240 hour(s)).   No results found for: HEPBSAG, HEPBSAB, HEPBIGM  Imaging: No results found.   Medications:    Assessment/ Plan:  61 y.o. caucasian male  with morbid obesity, alcohol abuse, ambulatory dysfunction, who was admitted to [facility] on 05/19/2017 for ongoing treatment of severe sacral decubitus as well as wet gangrene of the right foot associated with sepsis and osteomyelitis.   1.  Acute renal failure, etiology currently unclear, but [facility] hospital felt this was ATN. Negative ANA/SPEP/ANCA 2.  Chronic kidney disease unspecified (baseline Cr unknown at this time.) Cr 1.1  in 08/2014 3.  Anemia of CKD.  4.  Osteomyelitis of the right foot. 5.  Hypocalcemia and hyperphosphatemia 6.  SHPTH 7. Hypokalemia  Plan:  Overall renal function remains poor but BUN and creatinine have both slightly improved. Patient continues to have good urine output. Therefore no urgent indication for dialysis at the moment. Hemoglobin is also improved to 8.5. Also recommend monitoring serum phosphorus as well as phosphorus was quite high at 7.0. Continue binder therapy as prescribed. We will continue to monitor the patient's progress closely.   LOS: 0 Theora Vankirk 6/22/20188:19 AM

## 2017-06-12 LAB — VANCOMYCIN, TROUGH: Vancomycin Tr: 11 ug/mL — ABNORMAL LOW (ref 15–20)

## 2017-06-13 LAB — CBC WITH DIFFERENTIAL/PLATELET
BASOS ABS: 0 10*3/uL (ref 0.0–0.1)
Basophils Relative: 0 %
EOS PCT: 10 %
Eosinophils Absolute: 1.3 10*3/uL — ABNORMAL HIGH (ref 0.0–0.7)
HEMATOCRIT: 24.8 % — AB (ref 39.0–52.0)
HEMOGLOBIN: 7.9 g/dL — AB (ref 13.0–17.0)
LYMPHS ABS: 3 10*3/uL (ref 0.7–4.0)
LYMPHS PCT: 23 %
MCH: 29.8 pg (ref 26.0–34.0)
MCHC: 31.9 g/dL (ref 30.0–36.0)
MCV: 93.6 fL (ref 78.0–100.0)
MONOS PCT: 14 %
Monocytes Absolute: 1.8 10*3/uL — ABNORMAL HIGH (ref 0.1–1.0)
NEUTROS ABS: 6.8 10*3/uL (ref 1.7–7.7)
Neutrophils Relative %: 53 %
Platelets: 240 10*3/uL (ref 150–400)
RBC: 2.65 MIL/uL — ABNORMAL LOW (ref 4.22–5.81)
RDW: 18 % — AB (ref 11.5–15.5)
WBC: 12.9 10*3/uL — ABNORMAL HIGH (ref 4.0–10.5)

## 2017-06-13 NOTE — Progress Notes (Signed)
  Subjective:     UOP 2300 cc Laying in bed. Resting quietly. No acute c/o   Objective:  Vital signs in last 24 hours:    T 97.6  P 83  RR 18  BP 121/60  Intake/Output:    Physical Exam: General: NAD,    Head/ENT Normocephalic, moist oral mucus membranes  Eyes:  anicteric  Neck: supple  Lungs:  coarse at bases  Heart: Regular, no rub  Abdomen:   soft, obese, non tender  Extremities:  ++ b/l chronic edema, both feet in bandage  Neurologic: Alert, able to answer questions  Skin: Chronic statis dermatitis  Foley present    Basic Metabolic Panel:  Recent Labs Lab 06/09/17 0500 06/10/17 1537  NA 135  --   K 3.3* 3.5  CL 99*  --   CO2 25  --   GLUCOSE 123*  --   BUN 64*  --   CREATININE 4.01*  --   CALCIUM 8.5*  --    Lab Results  Component Value Date   PTH 325 (H) 05/24/2017   PTH Comment 05/24/2017   CALCIUM 8.5 (L) 06/09/2017   CAION 1.13 10/15/2011   PHOS 7.0 (H) 06/06/2017    CBC:  Recent Labs Lab 06/09/17 0500 06/13/17 0600  WBC 11.0* 12.9*  NEUTROABS  --  6.8  HGB 8.5* 7.9*  HCT 26.8* 24.8*  MCV 93.7 93.6  PLT 242 240     Microbiology:  No results found for this or any previous visit (from the past 240 hour(s)).   No results found for: HEPBSAG, HEPBSAB, HEPBIGM  Imaging: No results found.   Medications:    Assessment/ Plan:  61 y.o. caucasian male  with morbid obesity, alcohol abuse, ambulatory dysfunction, who was admitted to [facility] on 05/19/2017 for ongoing treatment of severe sacral decubitus as well as wet gangrene of the right foot associated with sepsis and osteomyelitis.   1.  Acute renal failure, etiology currently unclear, but [facility] hospital felt this was ATN.  2.  Chronic kidney disease unspecified (baseline Cr unknown at this time.) Cr 1.1 in 08/2014 3.  Anemia of CKD.  4.  Osteomyelitis of the right foot. 5.  Hypocalcemia and hyperphosphatemia 6.  SHPTH 7. Hypokalemia  The patient did end up  requiring dialysis at the outside hospital for acute renal failure. His temporary dialysis catheter was removed on 05/16/17.  At present, he has good uop. S Creatinine is about the same. Electrolytes and Volume status are acceptable No acute indication for Dialysis at present. Abx for Osteomyelitis as per primary team. Dosing assistance from pharmacy.  Continue Calcium acetate and small dose of Calcitriol Will continue to monitor closely.   D/c fluid restriction as UOP is good continue spironolactone for hypokalemia    LOS: 0 Chad Schaefer 6/25/20182:36 PM

## 2017-06-15 LAB — CBC
HEMATOCRIT: 22 % — AB (ref 39.0–52.0)
Hemoglobin: 7.1 g/dL — ABNORMAL LOW (ref 13.0–17.0)
MCH: 31 pg (ref 26.0–34.0)
MCHC: 32.3 g/dL (ref 30.0–36.0)
MCV: 96.1 fL (ref 78.0–100.0)
PLATELETS: 207 10*3/uL (ref 150–400)
RBC: 2.29 MIL/uL — ABNORMAL LOW (ref 4.22–5.81)
RDW: 18.6 % — AB (ref 11.5–15.5)
WBC: 10.7 10*3/uL — ABNORMAL HIGH (ref 4.0–10.5)

## 2017-06-15 LAB — RENAL FUNCTION PANEL
ANION GAP: 10 (ref 5–15)
Albumin: 1.9 g/dL — ABNORMAL LOW (ref 3.5–5.0)
BUN: 76 mg/dL — ABNORMAL HIGH (ref 6–20)
CHLORIDE: 100 mmol/L — AB (ref 101–111)
CO2: 26 mmol/L (ref 22–32)
Calcium: 8.2 mg/dL — ABNORMAL LOW (ref 8.9–10.3)
Creatinine, Ser: 4.29 mg/dL — ABNORMAL HIGH (ref 0.61–1.24)
GFR calc Af Amer: 16 mL/min — ABNORMAL LOW (ref >60)
GFR calc non Af Amer: 14 mL/min — ABNORMAL LOW (ref >60)
GLUCOSE: 96 mg/dL (ref 65–99)
Phosphorus: 5.2 mg/dL — ABNORMAL HIGH (ref 2.5–4.6)
Potassium: 2.9 mmol/L — ABNORMAL LOW (ref 3.5–5.1)
Sodium: 136 mmol/L (ref 135–145)

## 2017-06-15 LAB — SEDIMENTATION RATE: Sed Rate: 75 mm/h — ABNORMAL HIGH (ref 0–16)

## 2017-06-15 LAB — C-REACTIVE PROTEIN: CRP: 5.8 mg/dL — ABNORMAL HIGH (ref <1.0)

## 2017-06-15 LAB — MAGNESIUM: MAGNESIUM: 2.8 mg/dL — AB (ref 1.7–2.4)

## 2017-06-15 LAB — VANCOMYCIN, TROUGH: Vancomycin Tr: 25 ug/mL (ref 15–20)

## 2017-06-15 NOTE — Progress Notes (Signed)
  Subjective:     UOP 3300 cc Laying in bed. Resting quietly. No acute c/o   Objective:  Vital signs in last 24 hours:    T 98.4  P 88  RR 18  BP 102/57  Intake/Output:    Physical Exam: General: NAD,    Head/ENT Normocephalic, moist oral mucus membranes  Eyes:  anicteric  Neck: supple  Lungs:  coarse at bases  Heart: Regular, no rub  Abdomen:   soft, obese, non tender  Extremities:  ++ b/l chronic edema, both feet in bandage  Neurologic: Alert, able to answer questions  Skin: Chronic statis dermatitis, dry skin over face and upper body  Foley present    Basic Metabolic Panel:  Recent Labs Lab 06/09/17 0500 06/10/17 1537 06/15/17 0620  NA 135  --  136  K 3.3* 3.5 2.9*  CL 99*  --  100*  CO2 25  --  26  GLUCOSE 123*  --  96  BUN 64*  --  76*  CREATININE 4.01*  --  4.29*  CALCIUM 8.5*  --  8.2*  MG  --   --  2.8*  PHOS  --   --  5.2*   Lab Results  Component Value Date   PTH 325 (H) 05/24/2017   PTH Comment 05/24/2017   CALCIUM 8.2 (L) 06/15/2017   CAION 1.13 10/15/2011   PHOS 5.2 (H) 06/15/2017    CBC:  Recent Labs Lab 06/09/17 0500 06/13/17 0600 06/15/17 0620  WBC 11.0* 12.9* 10.7*  NEUTROABS  --  6.8  --   HGB 8.5* 7.9* 7.1*  HCT 26.8* 24.8* 22.0*  MCV 93.7 93.6 96.1  PLT 242 240 207     Microbiology:  No results found for this or any previous visit (from the past 240 hour(s)).   No results found for: HEPBSAG, HEPBSAB, HEPBIGM  Imaging: No results found.   Medications:    Assessment/ Plan:  61 y.o. caucasian male  with morbid obesity, alcohol abuse, ambulatory dysfunction, who was admitted to [facility] on 05/19/2017 for ongoing treatment of severe sacral decubitus as well as wet gangrene of the right foot associated with sepsis and osteomyelitis.   1.  Acute renal failure, etiology currently unclear, but [facility] hospital felt this was ATN.  2.  Chronic kidney disease unspecified (baseline Cr unknown at this time.)  Cr 1.1 in 08/2014 3.  Anemia of CKD.  4.  Osteomyelitis of the right foot. 5.  Hypocalcemia and hyperphosphatemia 6.  SHPTH 7.  Hypokalemia  The patient did end up requiring dialysis at the outside hospital for acute renal failure. His temporary dialysis catheter was removed on 05/16/17.  At present, he has good uop. S Creatinine is about the same. Electrolytes and Volume status are acceptable No acute indication for Dialysis at present. Abx for Osteomyelitis as per primary team. Dosing assistance from pharmacy.  Continue Calcium acetate and small dose of Calcitriol Will continue to monitor closely.   D/c fluid restriction as UOP is good continue spironolactone for hypokalemia    LOS: 0 Chad Schaefer 6/27/20185:03 PM

## 2017-06-16 LAB — RENAL FUNCTION PANEL
Albumin: 2 g/dL — ABNORMAL LOW (ref 3.5–5.0)
Anion gap: 8 (ref 5–15)
BUN: 76 mg/dL — ABNORMAL HIGH (ref 6–20)
CHLORIDE: 102 mmol/L (ref 101–111)
CO2: 27 mmol/L (ref 22–32)
CREATININE: 4.41 mg/dL — AB (ref 0.61–1.24)
Calcium: 8.5 mg/dL — ABNORMAL LOW (ref 8.9–10.3)
GFR calc non Af Amer: 13 mL/min — ABNORMAL LOW (ref >60)
GFR, EST AFRICAN AMERICAN: 15 mL/min — AB (ref >60)
Glucose, Bld: 86 mg/dL (ref 65–99)
POTASSIUM: 3.5 mmol/L (ref 3.5–5.1)
Phosphorus: 5.7 mg/dL — ABNORMAL HIGH (ref 2.5–4.6)
Sodium: 137 mmol/L (ref 135–145)

## 2017-06-16 LAB — CBC
HEMATOCRIT: 24.3 % — AB (ref 39.0–52.0)
Hemoglobin: 7.6 g/dL — ABNORMAL LOW (ref 13.0–17.0)
MCH: 30 pg (ref 26.0–34.0)
MCHC: 31.3 g/dL (ref 30.0–36.0)
MCV: 96 fL (ref 78.0–100.0)
PLATELETS: 203 10*3/uL (ref 150–400)
RBC: 2.53 MIL/uL — AB (ref 4.22–5.81)
RDW: 18.7 % — ABNORMAL HIGH (ref 11.5–15.5)
WBC: 9.9 10*3/uL (ref 4.0–10.5)

## 2017-06-17 LAB — RENAL FUNCTION PANEL
ALBUMIN: 2.1 g/dL — AB (ref 3.5–5.0)
Anion gap: 7 (ref 5–15)
BUN: 74 mg/dL — AB (ref 6–20)
CO2: 26 mmol/L (ref 22–32)
CREATININE: 4.23 mg/dL — AB (ref 0.61–1.24)
Calcium: 8.7 mg/dL — ABNORMAL LOW (ref 8.9–10.3)
Chloride: 99 mmol/L — ABNORMAL LOW (ref 101–111)
GFR calc Af Amer: 16 mL/min — ABNORMAL LOW (ref >60)
GFR calc non Af Amer: 14 mL/min — ABNORMAL LOW (ref >60)
GLUCOSE: 90 mg/dL (ref 65–99)
PHOSPHORUS: 4.8 mg/dL — AB (ref 2.5–4.6)
Potassium: 3.5 mmol/L (ref 3.5–5.1)
SODIUM: 132 mmol/L — AB (ref 135–145)

## 2017-06-17 LAB — CBC
HCT: 22.1 % — ABNORMAL LOW (ref 39.0–52.0)
Hemoglobin: 7.1 g/dL — ABNORMAL LOW (ref 13.0–17.0)
MCH: 30.7 pg (ref 26.0–34.0)
MCHC: 32.1 g/dL (ref 30.0–36.0)
MCV: 95.7 fL (ref 78.0–100.0)
PLATELETS: 199 10*3/uL (ref 150–400)
RBC: 2.31 MIL/uL — ABNORMAL LOW (ref 4.22–5.81)
RDW: 18.7 % — ABNORMAL HIGH (ref 11.5–15.5)
WBC: 12.2 10*3/uL — AB (ref 4.0–10.5)

## 2017-06-17 NOTE — Progress Notes (Signed)
  Subjective:    UOP 3500 cc Laying in bed. Resting quietly. No acute c/o   Objective:  Vital signs in last 24 hours:    T 98.6  P 91  RR 20  BP 94/52  Intake/Output:    Physical Exam: General: NAD,  Super morbid obese  Head/ENT Normocephalic,   Eyes:  anicteric  Neck: supple  Lungs:  coarse at bases  Heart: Regular, no rub  Abdomen:   soft, obese, non tender  Extremities:  ++ b/l chronic edema, both feet in bandage  Neurologic: Resting quietly  Skin: Chronic statis dermatitis, dry skin over face and upper body  Foley present    Basic Metabolic Panel:  Recent Labs Lab 06/10/17 1537 06/15/17 0620 06/16/17 0613  NA  --  136 137  K 3.5 2.9* 3.5  CL  --  100* 102  CO2  --  26 27  GLUCOSE  --  96 86  BUN  --  76* 76*  CREATININE  --  4.29* 4.41*  CALCIUM  --  8.2* 8.5*  MG  --  2.8*  --   PHOS  --  5.2* 5.7*   Lab Results  Component Value Date   PTH 325 (H) 05/24/2017   PTH Comment 05/24/2017   CALCIUM 8.5 (L) 06/16/2017   CAION 1.13 10/15/2011   PHOS 5.7 (H) 06/16/2017    CBC:  Recent Labs Lab 06/13/17 0600 06/15/17 0620 06/16/17 0613  WBC 12.9* 10.7* 9.9  NEUTROABS 6.8  --   --   HGB 7.9* 7.1* 7.6*  HCT 24.8* 22.0* 24.3*  MCV 93.6 96.1 96.0  PLT 240 207 203     Microbiology:  No results found for this or any previous visit (from the past 240 hour(s)).   No results found for: HEPBSAG, HEPBSAB, HEPBIGM  Imaging: No results found.   Medications:    Assessment/ Plan:  61 y.o. caucasian male  with morbid obesity, alcohol abuse, ambulatory dysfunction, who was admitted to [facility] on 05/19/2017 for ongoing treatment of severe sacral decubitus as well as wet gangrene of the right foot associated with sepsis and osteomyelitis.   1.  Acute renal failure, etiology currently unclear, but [facility] hospital felt this was ATN.  2.  Chronic kidney disease unspecified (baseline Cr unknown at this time.) Cr 1.1 in 08/2014 3.  Anemia of  CKD.  4.  Osteomyelitis of the right foot. 5.  Hypocalcemia and hyperphosphatemia 6.  SHPTH 7.  Hypokalemia  The patient did end up requiring dialysis at the outside hospital for acute renal failure. His temporary dialysis catheter was removed on 05/16/17.  At present, he has good uop. S Creatinine is about the same. Electrolytes and Volume status are acceptable No acute indication for Dialysis at present. Abx for Osteomyelitis as per primary team. Dosing assistance from pharmacy.  Continue Calcium acetate  Will continue to monitor closely.   D/c fluid restriction as UOP is good continue spironolactone for hypokalemia 24 hr urine for Creatinine clearance    LOS: 0 Chad Schaefer 6/29/20189:26 AM

## 2017-06-18 LAB — CREATININE CLEARANCE, URINE, 24 HOUR
CREAT CLEAR: 22 mL/min — AB (ref 75–125)
CREATININE 24H UR: 1310 mg/d (ref 800–2000)
CREATININE, URINE: 51.37 mg/dL
Collection Interval-CRCL: 24 hours
Urine Total Volume-CRCL: 2550 mL

## 2017-08-10 ENCOUNTER — Inpatient Hospital Stay
Admission: EM | Admit: 2017-08-10 | Payer: Self-pay | Source: Other Acute Inpatient Hospital | Admitting: Family Medicine

## 2019-01-20 DEATH — deceased
